# Patient Record
Sex: Female | Born: 2000 | Race: Black or African American | Hispanic: No | Smoking: Current some day smoker
Health system: Southern US, Community
[De-identification: ages and names within clinical notes are randomized; demographics above are authoritative.]

## PROBLEM LIST (undated history)

## (undated) ENCOUNTER — Emergency Department (HOSPITAL_COMMUNITY): Admission: EM | Discharge status: Absent without leave | Payer: Medicaid Other

## (undated) DIAGNOSIS — Z789 Other specified health status: Secondary | ICD-10-CM

## (undated) HISTORY — PX: NO PAST SURGERIES: SHX2092

---
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
## 2018-06-29 ENCOUNTER — Other Ambulatory Visit: Payer: Self-pay

## 2018-06-29 ENCOUNTER — Encounter (HOSPITAL_COMMUNITY): Payer: Self-pay

## 2018-06-29 ENCOUNTER — Emergency Department (HOSPITAL_COMMUNITY): Payer: Self-pay

## 2018-06-29 ENCOUNTER — Emergency Department (HOSPITAL_COMMUNITY)
Admission: EM | Admit: 2018-06-29 | Discharge: 2018-06-30 | Disposition: A | Discharge status: Home / Self Care | Payer: Self-pay | Attending: Emergency Medicine | Admitting: Emergency Medicine

## 2018-06-29 DIAGNOSIS — T148XXA Other injury of unspecified body region, initial encounter: Secondary | ICD-10-CM

## 2018-06-29 DIAGNOSIS — Y929 Unspecified place or not applicable: Secondary | ICD-10-CM | POA: Insufficient documentation

## 2018-06-29 DIAGNOSIS — S81811A Laceration without foreign body, right lower leg, initial encounter: Secondary | ICD-10-CM | POA: Insufficient documentation

## 2018-06-29 DIAGNOSIS — Y939 Activity, unspecified: Secondary | ICD-10-CM | POA: Insufficient documentation

## 2018-06-29 DIAGNOSIS — Y999 Unspecified external cause status: Secondary | ICD-10-CM | POA: Insufficient documentation

## 2018-06-29 HISTORY — DX: Other specified health status: Z78.9

## 2018-06-29 LAB — I-STAT CHEM 8, ED
BUN: 8 mg/dL (ref 6–20)
Calcium, Ion: 1.01 mmol/L — ABNORMAL LOW (ref 1.15–1.40)
Chloride: 106 mmol/L (ref 98–111)
Creatinine, Ser: 1.2 mg/dL — ABNORMAL HIGH (ref 0.44–1.00)
Glucose, Bld: 149 mg/dL — ABNORMAL HIGH (ref 70–99)
HCT: 34 % — ABNORMAL LOW (ref 36.0–46.0)
Hemoglobin: 11.6 g/dL — ABNORMAL LOW (ref 12.0–15.0)
Potassium: 2.9 mmol/L — ABNORMAL LOW (ref 3.5–5.1)
Sodium: 139 mmol/L (ref 135–145)
TCO2: 17 mmol/L — ABNORMAL LOW (ref 22–32)

## 2018-06-29 LAB — BPAM FFP
Blood Product Expiration Date: 202007112359
Blood Product Expiration Date: 202007112359
ISSUE DATE / TIME: 202006232155
ISSUE DATE / TIME: 202006232155
Unit Type and Rh: 600
Unit Type and Rh: 6200

## 2018-06-29 LAB — I-STAT BETA HCG BLOOD, ED (MC, WL, AP ONLY): I-stat hCG, quantitative: <5 m[IU]/mL (ref <5)

## 2018-06-29 LAB — PREPARE FRESH FROZEN PLASMA
Unit division: 0
Unit division: 0

## 2018-06-29 MED ORDER — CEFAZOLIN SODIUM-DEXTROSE 2-4 GM/100ML-% IV SOLN
2.0000 g | Freq: Once | INTRAVENOUS | Status: AC
  Administered 2018-06-29: 2 g via INTRAVENOUS
  Filled 2018-06-29: qty 100

## 2018-06-29 MED ORDER — ACETAMINOPHEN 500 MG PO TABS
1000.0000 mg | ORAL_TABLET | Freq: Once | ORAL | Status: AC
  Administered 2018-06-29: 1000 mg via ORAL
  Filled 2018-06-29: qty 2

## 2018-06-29 MED ORDER — LIDOCAINE-EPINEPHRINE (PF) 2 %-1:200000 IJ SOLN
10.0000 mL | Freq: Once | INTRAMUSCULAR | Status: AC
  Administered 2018-06-29: 10 mL
  Filled 2018-06-29: qty 20

## 2018-06-29 MED ORDER — TETANUS-DIPHTH-ACELL PERTUSSIS 5-2.5-18.5 LF-MCG/0.5 IM SUSP
0.5000 mL | Freq: Once | INTRAMUSCULAR | Status: AC
  Administered 2018-06-29: 0.5 mL via INTRAMUSCULAR
  Filled 2018-06-29: qty 0.5

## 2018-06-29 NOTE — ED Notes (Addendum)
[facility] call for update.
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
## 2018-06-29 NOTE — ED Notes (Signed)
Officer at the bedside speaking to the patient.

## 2018-06-30 LAB — TYPE AND SCREEN
ABO/RH(D): A POS
Antibody Screen: POSITIVE
PT AG Type: NEGATIVE
Unit division: 0
Unit division: 0

## 2018-06-30 LAB — BPAM RBC
Blood Product Expiration Date: 202007092359
Blood Product Expiration Date: 202007092359
ISSUE DATE / TIME: 202006232155
ISSUE DATE / TIME: 202006232155
Unit Type and Rh: 9500
Unit Type and Rh: 9500

## 2018-06-30 MED ORDER — BACITRACIN ZINC 500 UNIT/GM EX OINT: 1.0000 "application " | TOPICAL_OINTMENT | Freq: Two times a day (BID) | CUTANEOUS | 0 refills | Status: DC

## 2018-06-30 MED ORDER — CEPHALEXIN 500 MG PO CAPS: 1000.0000 mg | ORAL_CAPSULE | Freq: Two times a day (BID) | ORAL | 0 refills | Status: DC

## 2018-06-30 NOTE — ED Notes (Signed)
Wound to right leg noted to be bleeding through staples.  Pressure held for several minutes.  No bleeding noted afterwards.  Pressure dressing applied.  Discharged home.  Verbalized understanding of discharge instructions.

## 2018-06-30 NOTE — Discharge Instructions (Addendum)
1.  Keep your leg elevated for the next 48 hours as much as possible.  Continue to elevate and avoid prolonged standing for the next week. 2.  After 24 hours, clean your wounds twice daily with half hydrogen peroxide half water dry well and apply antibiotic ointment. 3.  Follow-up with the trauma clinic or your doctor for removal of your staples in 10 days. 4.  Return to the emergency department if signs of infection such as worsening pain, drainage, fever or other concerning symptoms.

## 2018-07-01 ENCOUNTER — Encounter: Payer: Self-pay | Admitting: Student

## 2018-07-06 ENCOUNTER — Encounter (HOSPITAL_COMMUNITY): Payer: Self-pay | Admitting: Emergency Medicine

## 2018-07-06 ENCOUNTER — Emergency Department (HOSPITAL_COMMUNITY): Payer: Self-pay

## 2018-07-06 ENCOUNTER — Emergency Department (HOSPITAL_COMMUNITY)
Admission: EM | Admit: 2018-07-06 | Discharge: 2018-07-06 | Disposition: A | Discharge status: Home / Self Care | Payer: Self-pay | Attending: Emergency Medicine | Admitting: Emergency Medicine

## 2018-07-06 DIAGNOSIS — R112 Nausea with vomiting, unspecified: Secondary | ICD-10-CM

## 2018-07-06 DIAGNOSIS — R1084 Generalized abdominal pain: Secondary | ICD-10-CM | POA: Insufficient documentation

## 2018-07-06 DIAGNOSIS — F17200 Nicotine dependence, unspecified, uncomplicated: Secondary | ICD-10-CM | POA: Insufficient documentation

## 2018-07-06 LAB — COMPREHENSIVE METABOLIC PANEL
ALT: 15 U/L (ref 0–44)
AST: 20 U/L (ref 15–41)
Albumin: 3.6 g/dL (ref 3.5–5.0)
Alkaline Phosphatase: 39 U/L (ref 38–126)
Anion gap: 8 (ref 5–15)
BUN: 8 mg/dL (ref 6–20)
CO2: 24 mmol/L (ref 22–32)
Calcium: 8.9 mg/dL (ref 8.9–10.3)
Chloride: 108 mmol/L (ref 98–111)
Creatinine, Ser: 0.88 mg/dL (ref 0.44–1.00)
GFR calc Af Amer: >60 mL/min (ref >60)
GFR calc non Af Amer: >60 mL/min (ref >60)
Glucose, Bld: 108 mg/dL — ABNORMAL HIGH (ref 70–99)
Potassium: 4.5 mmol/L (ref 3.5–5.1)
Sodium: 140 mmol/L (ref 135–145)
Total Bilirubin: 0.4 mg/dL (ref 0.3–1.2)
Total Protein: 6.6 g/dL (ref 6.5–8.1)

## 2018-07-06 LAB — CBC WITH DIFFERENTIAL/PLATELET
Abs Immature Granulocytes: 0.02 10*3/uL (ref 0.00–0.07)
Basophils Absolute: 0 10*3/uL (ref 0.0–0.1)
Basophils Relative: 1 %
Eosinophils Absolute: 0 10*3/uL (ref 0.0–0.5)
Eosinophils Relative: 1 %
HCT: 31.7 % — ABNORMAL LOW (ref 36.0–46.0)
Hemoglobin: 10 g/dL — ABNORMAL LOW (ref 12.0–15.0)
Immature Granulocytes: 0 %
Lymphocytes Relative: 21 %
Lymphs Abs: 1.1 10*3/uL (ref 0.7–4.0)
MCH: 29.1 pg (ref 26.0–34.0)
MCHC: 31.5 g/dL (ref 30.0–36.0)
MCV: 92.2 fL (ref 80.0–100.0)
Monocytes Absolute: 0.2 10*3/uL (ref 0.1–1.0)
Monocytes Relative: 5 %
Neutro Abs: 3.6 10*3/uL (ref 1.7–7.7)
Neutrophils Relative %: 72 %
Platelets: 276 10*3/uL (ref 150–400)
RBC: 3.44 MIL/uL — ABNORMAL LOW (ref 3.87–5.11)
RDW: 12.4 % (ref 11.5–15.5)
WBC: 5 10*3/uL (ref 4.0–10.5)
nRBC: 0 % (ref 0.0–0.2)

## 2018-07-06 LAB — URINALYSIS, ROUTINE W REFLEX MICROSCOPIC
Bilirubin Urine: NEGATIVE
Glucose, UA: NEGATIVE mg/dL
Hgb urine dipstick: NEGATIVE
Ketones, ur: NEGATIVE mg/dL
Nitrite: NEGATIVE
Protein, ur: 30 mg/dL — AB
Specific Gravity, Urine: 1.033 — ABNORMAL HIGH (ref 1.005–1.030)
pH: 8 (ref 5.0–8.0)

## 2018-07-06 LAB — I-STAT BETA HCG BLOOD, ED (MC, WL, AP ONLY): I-stat hCG, quantitative: <5 m[IU]/mL (ref <5)

## 2018-07-06 LAB — LIPASE, BLOOD: Lipase: 24 U/L (ref 11–51)

## 2018-07-06 LAB — POC OCCULT BLOOD, ED: Fecal Occult Bld: NEGATIVE

## 2018-07-06 MED ORDER — ONDANSETRON HCL 4 MG PO TABS: 4.0000 mg | ORAL_TABLET | Freq: Three times a day (TID) | ORAL | 0 refills | Status: DC | PRN

## 2018-07-06 MED ORDER — SODIUM CHLORIDE 0.9 % IV BOLUS
1000.0000 mL | Freq: Once | INTRAVENOUS | Status: AC
  Administered 2018-07-06: 11:00:00 1000 mL via INTRAVENOUS

## 2018-07-06 MED ORDER — ONDANSETRON HCL 4 MG/2ML IJ SOLN
4.0000 mg | Freq: Once | INTRAMUSCULAR | Status: AC
  Administered 2018-07-06: 11:00:00 4 mg via INTRAVENOUS

## 2018-07-06 MED ORDER — IOHEXOL 300 MG/ML  SOLN
100.0000 mL | Freq: Once | INTRAMUSCULAR | Status: AC | PRN
  Administered 2018-07-06: 100 mL via INTRAVENOUS

## 2018-07-06 MED ORDER — ONDANSETRON HCL 4 MG/2ML IJ SOLN
INTRAMUSCULAR | Status: AC
Start: 2018-07-06 — End: 2018-07-06
  Administered 2018-07-06: 11:00:00 4 mg
  Filled 2018-07-06: qty 2
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
## 2018-07-06 NOTE — Discharge Instructions (Signed)
You were seen in the ED today for abdominal pain, nausea, and vomiting. Your labwork was reassuring today and your CT scan was negative. Your urine was sent out for culture; you will get a call if it grows anything that requires antibiotics. Please take Zofran as needed for nausea. Please increase fluid intake. Please follow up with your PCP; if you do not have one you may follow up with [facility] for your primary care needs.

## 2018-07-06 NOTE — ED Triage Notes (Signed)
Vomiting starting this morning about 0700- denies pregnancy. Diarr also reported. And Denies drinking alcohol. No one in house sick.

## 2018-07-07 LAB — URINE CULTURE: Culture: <10000 — AB

## 2018-07-09 ENCOUNTER — Other Ambulatory Visit: Payer: Self-pay

## 2018-07-09 ENCOUNTER — Emergency Department (HOSPITAL_COMMUNITY)
Admission: EM | Admit: 2018-07-09 | Discharge: 2018-07-09 | Disposition: A | Discharge status: Home / Self Care | Payer: Medicaid Other | Attending: Pediatric Emergency Medicine | Admitting: Pediatric Emergency Medicine

## 2018-07-09 ENCOUNTER — Encounter (HOSPITAL_COMMUNITY): Payer: Self-pay | Admitting: Emergency Medicine

## 2018-07-09 DIAGNOSIS — Z79899 Other long term (current) drug therapy: Secondary | ICD-10-CM | POA: Insufficient documentation

## 2018-07-09 DIAGNOSIS — Z4802 Encounter for removal of sutures: Secondary | ICD-10-CM

## 2018-07-09 DIAGNOSIS — B373 Candidiasis of vulva and vagina: Secondary | ICD-10-CM | POA: Insufficient documentation

## 2018-07-09 DIAGNOSIS — B379 Candidiasis, unspecified: Secondary | ICD-10-CM

## 2018-07-09 DIAGNOSIS — A599 Trichomoniasis, unspecified: Secondary | ICD-10-CM | POA: Insufficient documentation

## 2018-07-09 DIAGNOSIS — F17200 Nicotine dependence, unspecified, uncomplicated: Secondary | ICD-10-CM | POA: Insufficient documentation

## 2018-07-09 DIAGNOSIS — R300 Dysuria: Secondary | ICD-10-CM

## 2018-07-09 DIAGNOSIS — S81811D Laceration without foreign body, right lower leg, subsequent encounter: Secondary | ICD-10-CM | POA: Insufficient documentation

## 2018-07-09 DIAGNOSIS — X58XXXA Exposure to other specified factors, initial encounter: Secondary | ICD-10-CM | POA: Insufficient documentation

## 2018-07-09 LAB — WET PREP, GENITAL
Clue Cells Wet Prep HPF POC: NONE SEEN
Sperm: NONE SEEN

## 2018-07-09 LAB — RAPID HIV SCREEN (HIV 1/2 AB+AG)
HIV 1/2 Antibodies: NONREACTIVE
HIV-1 P24 Antigen - HIV24: NONREACTIVE

## 2018-07-09 LAB — URINALYSIS, ROUTINE W REFLEX MICROSCOPIC
Bacteria, UA: NONE SEEN
Bilirubin Urine: NEGATIVE
Glucose, UA: NEGATIVE mg/dL
Hgb urine dipstick: NEGATIVE
Ketones, ur: 5 mg/dL — AB
Nitrite: NEGATIVE
Protein, ur: 30 mg/dL — AB
Specific Gravity, Urine: 1.032 — ABNORMAL HIGH (ref 1.005–1.030)
WBC, UA: >50 WBC/hpf — ABNORMAL HIGH (ref 0–5)
pH: 5 (ref 5.0–8.0)

## 2018-07-09 LAB — PREGNANCY, URINE: Preg Test, Ur: NEGATIVE

## 2018-07-09 MED ORDER — AZITHROMYCIN 1 G PO PACK
1.0000 g | PACK | Freq: Once | ORAL | Status: AC
  Administered 2018-07-09: 1 g via ORAL
  Filled 2018-07-09: qty 1

## 2018-07-09 MED ORDER — METRONIDAZOLE 500 MG PO TABS: 500.0000 mg | ORAL_TABLET | Freq: Two times a day (BID) | ORAL | 0 refills | Status: DC

## 2018-07-09 MED ORDER — CEFTRIAXONE SODIUM 1 G IJ SOLR
1.0000 g | Freq: Once | INTRAMUSCULAR | Status: AC
  Administered 2018-07-09: 1 g via INTRAMUSCULAR
  Filled 2018-07-09: qty 10

## 2018-07-09 MED ORDER — FLUCONAZOLE 150 MG PO TABS: 150.0000 mg | ORAL_TABLET | ORAL | 0 refills | Status: AC

## 2018-07-09 MED ORDER — LIDOCAINE HCL (PF) 1 % IJ SOLN
INTRAMUSCULAR | Status: AC
Start: 2018-07-09 — End: 2018-07-09
  Administered 2018-07-09: 2.1 mL
  Filled 2018-07-09: qty 5

## 2018-07-09 NOTE — ED Triage Notes (Signed)
Patient requesting staples removal at right lower leg , pt. added UTI symptoms - dysuria /urinary frequency , denies fever or chills .
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
## 2018-07-09 NOTE — Discharge Instructions (Addendum)
Continue to apply the bacitracin to your wound. It is healing well.   Your urinalysis suggests mild UTI. Several of your tests are pending as well.   You also have a yeast infection, as well as trichomoniasis.   Please establish primary care, resources were given.   You may return to the ED for new/worsening concerns as discussed.   Your HIV and Syphilis tests are pending. The gonorrhea and chlamydia testing is also pending. You were giving antibiotics to treat Gonorrhea and Chlamydia. You should use condoms and have one partner. You are increasing your risk of long-term effects from having unprotected sex. You must notify your sexual partners that you were treated, so that they can be treated. You should not have any sex for 2 weeks. You could re-infect yourself. Follow up with the Health Department.

## 2018-07-10 LAB — URINE CULTURE: Culture: NO GROWTH

## 2018-07-11 LAB — RPR: RPR Ser Ql: NONREACTIVE

## 2018-07-13 LAB — GC/CHLAMYDIA PROBE AMP (~~LOC~~) NOT AT ARMC
Chlamydia: NEGATIVE
Neisseria Gonorrhea: NEGATIVE

## 2018-07-30 ENCOUNTER — Other Ambulatory Visit: Payer: Self-pay

## 2018-07-30 ENCOUNTER — Emergency Department (HOSPITAL_BASED_OUTPATIENT_CLINIC_OR_DEPARTMENT_OTHER): Payer: Self-pay

## 2018-07-30 ENCOUNTER — Emergency Department (HOSPITAL_COMMUNITY)
Admission: EM | Admit: 2018-07-30 | Discharge: 2018-07-30 | Disposition: A | Discharge status: Home / Self Care | Payer: Self-pay | Attending: Emergency Medicine | Admitting: Emergency Medicine

## 2018-07-30 DIAGNOSIS — R52 Pain, unspecified: Secondary | ICD-10-CM

## 2018-07-30 DIAGNOSIS — F17210 Nicotine dependence, cigarettes, uncomplicated: Secondary | ICD-10-CM | POA: Insufficient documentation

## 2018-07-30 DIAGNOSIS — M7989 Other specified soft tissue disorders: Secondary | ICD-10-CM

## 2018-07-30 DIAGNOSIS — R2241 Localized swelling, mass and lump, right lower limb: Secondary | ICD-10-CM | POA: Insufficient documentation

## 2018-07-30 DIAGNOSIS — S81811D Laceration without foreign body, right lower leg, subsequent encounter: Secondary | ICD-10-CM | POA: Insufficient documentation

## 2018-07-30 DIAGNOSIS — W260XXD Contact with knife, subsequent encounter: Secondary | ICD-10-CM | POA: Insufficient documentation

## 2018-07-30 NOTE — Discharge Instructions (Addendum)
Elevate your right leg as much as possible when you are resting.  It is good to stay active and keep moving.  This should help with the swelling as well. Follow-up with your primary care provider if symptoms persist. If you develop redness, pain, or significantly worsening swelling to the leg, you may need reevaluation in the ER.
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
## 2018-07-30 NOTE — Progress Notes (Signed)
Right lower extremity venous duplex completed. Preliminary results in Chart review CV Proc. [place] Tracee Mccreery,RVS 07/30/2018, 6:32 PM
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
## 2019-02-05 ENCOUNTER — Other Ambulatory Visit: Payer: Self-pay

## 2019-02-05 ENCOUNTER — Encounter (HOSPITAL_COMMUNITY): Payer: Self-pay | Admitting: Emergency Medicine

## 2019-02-05 ENCOUNTER — Emergency Department (HOSPITAL_COMMUNITY)
Admission: EM | Admit: 2019-02-05 | Discharge: 2019-02-05 | Disposition: A | Discharge status: Home / Self Care | Payer: BLUE CROSS/BLUE SHIELD | Attending: Emergency Medicine | Admitting: Emergency Medicine

## 2019-02-05 DIAGNOSIS — Z20822 Contact with and (suspected) exposure to covid-19: Secondary | ICD-10-CM | POA: Diagnosis not present

## 2019-02-05 DIAGNOSIS — R0602 Shortness of breath: Secondary | ICD-10-CM | POA: Insufficient documentation

## 2019-02-05 DIAGNOSIS — R5383 Other fatigue: Secondary | ICD-10-CM | POA: Diagnosis not present

## 2019-02-05 DIAGNOSIS — U071 COVID-19: Secondary | ICD-10-CM

## 2019-02-05 DIAGNOSIS — R438 Other disturbances of smell and taste: Secondary | ICD-10-CM | POA: Diagnosis present

## 2019-02-05 DIAGNOSIS — F17290 Nicotine dependence, other tobacco product, uncomplicated: Secondary | ICD-10-CM | POA: Diagnosis not present

## 2019-02-05 NOTE — ED Notes (Signed)
Patient given discharge instructions patient verbalizes understanding. 

## 2019-02-05 NOTE — ED Notes (Signed)
Pt waiting for provider.

## 2019-02-05 NOTE — Discharge Instructions (Signed)
Today you were tested for COVID-19, these results should be available to you within the next 24 hours.  Please quarantine at home for the next 14 days, will need to limit encounters with other people to avoid spreading the virus.

## 2019-02-05 NOTE — ED Triage Notes (Signed)
Pt requesting COVID testing.  C/o sore throat, loss taste, and loss smell since yesterday.

## 2019-02-05 NOTE — ED Provider Notes (Signed)
[facility] EMERGENCY DEPARTMENT Provider Note   CSN: [id number] Arrival date & time: 02/05/19  1007     History Chief Complaint  Patient presents with  . COVID testing    Brittany Hoffman is a 19 y.o. female.  19 y.o female with no PMH presents to the ED with a chief complaint of loss of taste and smell x yesterday. Patient reports she has felt more fatigued than usual since the past week. She reports she has been increasing her liquids but has not taking any medication.  Patient reports she is also felt more short of breath, reports discussed for her to take a deep breath in the past week.  She is unsure if she is had any exposures to anyone with COVID-19.  She reports she is not having any fevers, does have some abdominal discomfort but no nausea, vomiting, diarrhea.  No chest pain, prior history of CAD or blood clots.  The history is provided by the patient.       Past Medical History:  Diagnosis Date  . Known health problems: none     Patient Active Problem List   Diagnosis Date Noted  . Chronic pain syndrome 08/13/2015  . Menorrhagia with regular cycle 11/20/2014  . Right knee pain 12/01/2012  . Contraception 10/29/2012  . Chronic headaches 10/29/2012  . Tinea versicolor 05/23/2010  . UNSPECIFIED ACQUIRED DEFORMITY OF TOE 08/21/2009  . OVERWEIGHT 03/28/2009  . VISUAL ACUITY, DECREASED 03/28/2009  . 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 BACK PAIN 03/28/2009    Past Surgical History:  Procedure Laterality Date  . NO PAST SURGERIES       OB History   No obstetric history on file.     Family History  Problem Relation Age of Onset  . Hypertension Mother     Social History   Tobacco Use  . Smoking status: Current Some Day Smoker    Types: Cigars  . Smokeless tobacco: Never Used  Substance Use Topics  . Alcohol use: Yes  . Drug use: Yes    Home Medications Prior to Admission medications   Medication Sig Start Date End Date Taking?  Authorizing Provider  adapalene (DIFFERIN) 0.1 % cream Apply topically at bedtime. to face (apply small amount)    [provider]  bacitracin ointment Apply 1 application topically 2 (two) times daily. 06/30/18   Charlesetta Shanks, MD  cephALEXin (KEFLEX) 500 MG capsule Take 2 capsules (1,000 mg total) by mouth 2 (two) times daily. 06/30/18   Charlesetta Shanks, MD  ciprofloxacin (CIPRO) 250 MG tablet Take 1 tablet (250 mg total) by mouth every 12 (twelve) hours. 12/24/16   Luvenia Redden, PA-C  ibuprofen (ADVIL,MOTRIN) 600 MG tablet Take 1 tablet (600 mg total) by mouth every 8 (eight) hours as needed. 12/01/12   Hairford, Tyler Pita, MD  metroNIDAZOLE (FLAGYL) 500 MG tablet Take 1 tablet (500 mg total) by mouth 2 (two) times daily. 07/09/18   Griffin Basil, NP  norgestimate-ethinyl estradiol (ORTHO-CYCLEN,SPRINTEC,PREVIFEM) 0.25-35 MG-MCG tablet Take 1 tablet by mouth daily. 11/20/14   Veatrice Bourbon, MD  ondansetron (ZOFRAN) 4 MG tablet Take 1 tablet (4 mg total) by mouth every 8 (eight) hours as needed for nausea or vomiting. 07/06/18   Eustaquio Maize, PA-C    Allergies    Patient has no known allergies.  Review of Systems   Review of Systems  Constitutional: Negative for fever.  Respiratory: Negative for shortness of breath.   Cardiovascular: Negative for  chest pain.  Gastrointestinal: Negative for abdominal pain.    Physical Exam Updated Vital Signs BP 114/85 (BP Location: Left Arm)   Pulse (!) 59   Temp 98.2 F (36.8 C) (Oral)   Resp 17   LMP 01/22/2019   SpO2 100%   Physical Exam Vitals and nursing note reviewed.  Constitutional:      General: She is not in acute distress.    Appearance: She is well-developed.  HENT:     Head: Normocephalic and atraumatic.     Mouth/Throat:     Pharynx: No oropharyngeal exudate.  Eyes:     Pupils: Pupils are equal, round, and reactive to light.  Cardiovascular:     Rate and Rhythm: Regular rhythm.     Heart sounds: Normal heart  sounds.  Pulmonary:     Effort: Pulmonary effort is normal. No respiratory distress.     Breath sounds: Normal breath sounds.  Abdominal:     General: Bowel sounds are normal. There is no distension.     Palpations: Abdomen is soft.     Tenderness: There is no abdominal tenderness.  Musculoskeletal:        General: No tenderness or deformity.     Cervical back: Normal range of motion.     Right lower leg: No edema.     Left lower leg: No edema.  Skin:    General: Skin is warm and dry.  Neurological:     Mental Status: She is alert and oriented to person, place, and time.     ED Results / Procedures / Treatments   Labs (all labs ordered are listed, but only abnormal results are displayed) Labs Reviewed  NOVEL CORONAVIRUS, NAA (HOSP ORDER, SEND-OUT TO REF LAB; TAT 18-24 HRS)    EKG None  Radiology No results found.  Procedures Procedures (including critical care time)  Medications Ordered in ED Medications - No data to display  ED Course  I have reviewed the triage vital signs and the nursing notes.  Pertinent labs & imaging results that were available during my care of the patient were reviewed by me and considered in my medical decision making (see chart for details).    MDM Rules/Calculators/A&P   Patient with different past medical history presents to the ED with complaints of shortness of breath, loss of taste and smell, fatigue.  Patient is unsure where she had exposure to anybody with COVID-19.  Has been decreasing her liquid intake, but has not seen improvement in her symptoms.  Patient arrived in the ED with stable vital signs, satting at 100% on room air, afebrile, no tachycardia or hypoxia lower suspicion for pulmonary embolism.  Denies any chest pain, prior cardiac history lower solution for ACS.  Patient will have Covid testing done and sent out.  She understands and agrees with management along with further testing if needed on an outpatient basis.   Brittany Hoffman was evaluated in Emergency Department on 02/05/2019 for the symptoms described in the history of present illness. She was evaluated in the context of the global COVID-19 pandemic, which necessitated consideration that the patient might be at risk for infection with the SARS-CoV-2 virus that causes COVID-19. Institutional protocols and algorithms that pertain to the evaluation of patients at risk for COVID-19 are in a state of rapid change based on information released by regulatory bodies including the CDC and federal and state organizations. These policies and algorithms were followed during the patient's care in the ED.   Portions  of this note were generated with Scientist, clinical (histocompatibility and immunogenetics). Dictation errors may occur despite best attempts at proofreading.  Final Clinical Impression(s) / ED Diagnoses Final diagnoses:  COVID-19    Rx / DC Orders ED Discharge Orders    None       Claude Manges, PA-C 02/05/19 1138    Margarita Grizzle, MD 02/06/19 [phone]

## 2019-02-06 LAB — NOVEL CORONAVIRUS, NAA (HOSP ORDER, SEND-OUT TO REF LAB; TAT 18-24 HRS): SARS-CoV-2, NAA: NOT DETECTED

## 2019-04-07 ENCOUNTER — Other Ambulatory Visit: Payer: Self-pay

## 2019-04-07 ENCOUNTER — Emergency Department (HOSPITAL_COMMUNITY)
Admission: EM | Admit: 2019-04-07 | Discharge: 2019-04-07 | Disposition: A | Discharge status: Home / Self Care | Payer: BLUE CROSS/BLUE SHIELD | Attending: Emergency Medicine | Admitting: Emergency Medicine

## 2019-04-07 ENCOUNTER — Encounter (HOSPITAL_COMMUNITY): Payer: Self-pay | Admitting: Emergency Medicine

## 2019-04-07 DIAGNOSIS — Z202 Contact with and (suspected) exposure to infections with a predominantly sexual mode of transmission: Secondary | ICD-10-CM | POA: Diagnosis present

## 2019-04-07 DIAGNOSIS — Z711 Person with feared health complaint in whom no diagnosis is made: Secondary | ICD-10-CM

## 2019-04-07 DIAGNOSIS — Z720 Tobacco use: Secondary | ICD-10-CM | POA: Diagnosis not present

## 2019-04-07 DIAGNOSIS — B373 Candidiasis of vulva and vagina: Secondary | ICD-10-CM | POA: Insufficient documentation

## 2019-04-07 DIAGNOSIS — B3731 Acute candidiasis of vulva and vagina: Secondary | ICD-10-CM

## 2019-04-07 LAB — WET PREP, GENITAL
Sperm: NONE SEEN
Trich, Wet Prep: NONE SEEN

## 2019-04-07 LAB — POC URINE PREG, ED: Preg Test, Ur: NEGATIVE

## 2019-04-07 MED ORDER — CEFTRIAXONE SODIUM 500 MG IJ SOLR
500.0000 mg | Freq: Once | INTRAMUSCULAR | Status: AC
  Administered 2019-04-07: 11:00:00 500 mg via INTRAMUSCULAR
  Filled 2019-04-07: qty 500

## 2019-04-07 MED ORDER — LIDOCAINE HCL (PF) 1 % IJ SOLN
INTRAMUSCULAR | Status: AC
  Administered 2019-04-07: 11:00:00 1 mL
  Filled 2019-04-07: qty 5

## 2019-04-07 MED ORDER — FLUCONAZOLE 200 MG PO TABS
200.0000 mg | ORAL_TABLET | Freq: Once | ORAL | Status: AC
  Administered 2019-04-07: 11:00:00 200 mg via ORAL
  Filled 2019-04-07: qty 1

## 2019-04-07 MED ORDER — DOXYCYCLINE HYCLATE 100 MG PO CAPS: 100.0000 mg | ORAL_CAPSULE | Freq: Two times a day (BID) | ORAL | 0 refills | Status: AC

## 2019-04-07 NOTE — ED Triage Notes (Signed)
Pt. Presents from home c/o vaginal discharge, irritation and swelling for the past two days. Patient denies fever or chills.

## 2019-04-07 NOTE — Discharge Instructions (Signed)
In the emergency department today you were diagnosed with a yeast infection.  You were given a single dose of fluconazole which should alleviate the symptoms.  We do not yet have the results back for your gonorrhea and Chlamydia test.  Please check your MyChart account for the next few days for the results of this test.  In the meantime, per our discussion, you have elected to go ahead and treat the symptoms.  You are given an injection of Rocephin in the emergency department and I am also discharging you on 7 days of an antibiotic called doxycycline.  Please be sure to take this antibiotic twice a day.  Please be sure to complete the full course of this antibiotic.  Please do not hesitate to return to the emergency department with any new or worsening symptoms.

## 2019-04-07 NOTE — ED Provider Notes (Signed)
[facility] EMERGENCY DEPARTMENT Provider Note   CSN: [id number] Arrival date & time: 04/07/19  0900     History Chief Complaint  Patient presents with  . Vaginal Discharge    Brittany Hoffman is a 19 y.o. female.  HPI 19 y.o. female with complaints/comments per nursing/medical assistant note, with all such history reviewed for accuracy and confirmed by myself.  The patient's relevant past medical, surgical and social history was reviewed in Epic.  HPI Comments: Brittany Hoffman is a 19 y.o. female with a history of trichomoniasis last year who presents to the Emergency Department complaining of vaginal discharge for the past 3 days.  Patient states that she began noticing white/clumpy nonodorous vaginal discharge which has been progressively worsening.  She reports associated vaginal irritation.  She is not on any regular medications.  She states she is sexually active with 1 female partner and denies that they use protection.  Her last menstrual period was on March 4 of last month and lasted 7 days, which she states is normal for her.  She denies dysuria, hematuria, any urinary changes, fevers, chills, chest pain, shortness of breath, vaginal bleeding.    Past Medical History:  Diagnosis Date  . Known health problems: none     Patient Active Problem List   Diagnosis Date Noted  . Chronic pain syndrome 08/13/2015  . Menorrhagia with regular cycle 11/20/2014  . Right knee pain 12/01/2012  . Contraception 10/29/2012  . Chronic headaches 10/29/2012  . Tinea versicolor 05/23/2010  . UNSPECIFIED ACQUIRED DEFORMITY OF TOE 08/21/2009  . OVERWEIGHT 03/28/2009  . VISUAL ACUITY, DECREASED 03/28/2009  . 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 BACK PAIN 03/28/2009    Past Surgical History:  Procedure Laterality Date  . NO PAST SURGERIES       OB History   No obstetric history on file.     Family History  Problem Relation Age of Onset  . Hypertension Mother     Social  History   Tobacco Use  . Smoking status: Current Some Day Smoker    Types: Cigars  . Smokeless tobacco: Never Used  Substance Use Topics  . Alcohol use: Yes  . Drug use: Yes    Home Medications Prior to Admission medications   Medication Sig Start Date End Date Taking? Authorizing Provider  adapalene (DIFFERIN) 0.1 % cream Apply topically at bedtime. to face (apply small amount)    [provider]  bacitracin ointment Apply 1 application topically 2 (two) times daily. 06/30/18   Arby Barrette, MD  cephALEXin (KEFLEX) 500 MG capsule Take 2 capsules (1,000 mg total) by mouth 2 (two) times daily. 06/30/18   Arby Barrette, MD  ciprofloxacin (CIPRO) 250 MG tablet Take 1 tablet (250 mg total) by mouth every 12 (twelve) hours. 12/24/16   Marny Lowenstein, PA-C  ibuprofen (ADVIL,MOTRIN) 600 MG tablet Take 1 tablet (600 mg total) by mouth every 8 (eight) hours as needed. 12/01/12   Hairford, Ricki Miller, MD  metroNIDAZOLE (FLAGYL) 500 MG tablet Take 1 tablet (500 mg total) by mouth 2 (two) times daily. 07/09/18   Lorin Picket, NP  norgestimate-ethinyl estradiol (ORTHO-CYCLEN,SPRINTEC,PREVIFEM) 0.25-35 MG-MCG tablet Take 1 tablet by mouth daily. 11/20/14   Bonney Aid, MD  ondansetron (ZOFRAN) 4 MG tablet Take 1 tablet (4 mg total) by mouth every 8 (eight) hours as needed for nausea or vomiting. 07/06/18   Tanda Rockers, PA-C    Allergies    Patient has no known allergies.  Review of Systems   Review of Systems  Constitutional: Negative for chills and fever.  HENT: Negative for congestion and rhinorrhea.   Respiratory: Negative for shortness of breath.   Cardiovascular: Negative for chest pain and leg swelling.  Gastrointestinal: Negative for abdominal pain, diarrhea, nausea and vomiting.  Genitourinary: Positive for vaginal discharge and vaginal pain. Negative for decreased urine volume, difficulty urinating, dysuria, frequency and vaginal bleeding.  All other systems reviewed  and are negative.  Physical Exam Updated Vital Signs BP 119/73 (BP Location: Left Arm)   Pulse 64   Temp 98.4 F (36.9 C) (Oral)   Resp 16   Ht 6' (1.829 m)   Wt 83.9 kg   LMP 03/08/2019 (Exact Date)   SpO2 100%   BMI 25.09 kg/m   Physical Exam Vitals and nursing note reviewed.  Constitutional:      Appearance: Normal appearance. She is normal weight.  HENT:     Head: Normocephalic and atraumatic.     Right Ear: External ear normal.     Left Ear: External ear normal.     Nose: Nose normal.     Mouth/Throat:     Mouth: Mucous membranes are moist.     Pharynx: Oropharynx is clear. No oropharyngeal exudate or posterior oropharyngeal erythema.  Eyes:     Extraocular Movements: Extraocular movements intact.  Cardiovascular:     Rate and Rhythm: Normal rate.     Pulses: Normal pulses.  Pulmonary:     Effort: Pulmonary effort is normal.  Abdominal:     General: Abdomen is flat.     Tenderness: There is no abdominal tenderness.  Genitourinary:    Comments: Pelvic exam performed with a female nursing chaperone.  Normal vulvar anatomy visualized.  No overlying erythema or edema appreciated.  No palpable vulvar tenderness. Normal-appearing vaginal mucosa.  Cervix is diffusely erythematous.  Moderate amount of yellow/white vaginal discharge appreciated in the vaginal vault.  Negative chandelier sign.  Cervical os is closed. Musculoskeletal:        General: Normal range of motion.     Cervical back: Normal range of motion.  Skin:    General: Skin is warm and dry.     Capillary Refill: Capillary refill takes less than 2 seconds.  Neurological:     General: No focal deficit present.     Mental Status: She is alert and oriented to person, place, and time.  Psychiatric:        Mood and Affect: Mood normal.        Behavior: Behavior normal.    ED Results / Procedures / Treatments   Labs (all labs ordered are listed, but only abnormal results are displayed) Labs Reviewed  WET  PREP, GENITAL - Abnormal; Notable for the following components:      Result Value   Yeast Wet Prep HPF POC PRESENT (*)    Clue Cells Wet Prep HPF POC PRESENT (*)    WBC, Wet Prep HPF POC MANY (*)    All other components within normal limits  POC URINE PREG, ED  GC/CHLAMYDIA PROBE AMP ([facility]) NOT AT [facility]   EKG None  Radiology No results found.  Procedures Procedures   Medications Ordered in ED Medications  cefTRIAXone (ROCEPHIN) injection 500 mg (has no administration in time range)  fluconazole (DIFLUCAN) tablet 200 mg (has no administration in time range)    ED Course  I have reviewed the triage vital signs and the nursing notes.  Pertinent labs &  imaging results that were available during my care of the patient were reviewed by me and considered in my medical decision making (see chart for details).    MDM Rules/Calculators/A&P                       Patient is a 19 year old African-American female that presents to the emergency department with 2 days of vaginal discharge and irritation.  Wet prep is significant for yeast as well as white blood cells.  Urine pregnancy test is negative.  Gonorrhea/chlamydia is pending.  Patient elected to treat gonorrhea/chlamydia empirically.  Patient given a single dose of oral fluconazole in the emergency department for yeast.  Single Rocephin injection given IM for empiric treatment of gonorrhea.  Will discharge patient on 7 days twice daily of doxycycline for empiric coverage of chlamydia.  Discussed this with patient and she understands to continue to check her results in MyChart for the next few days.  She also understands that she needs to discuss her results if positive with all of her sexual partners so they can additionally be treated.  Strict return precautions given and she understands to return to the emergency department with any new or worsening symptoms.  Her questions were answered.  She was amicable at the time of discharge.   Vital signs stable.  Final Clinical Impression(s) / ED Diagnoses Final diagnoses:  Vaginal yeast infection  Concern about STD in female without diagnosis    Rx / DC Orders ED Discharge Orders         Ordered    doxycycline (VIBRAMYCIN) 100 MG capsule  2 times daily     04/07/19 1055           Rayna Sexton, PA-C 04/07/19 1107    Sherwood Gambler, MD 04/07/19 1611

## 2019-04-08 LAB — GC/CHLAMYDIA PROBE AMP (~~LOC~~) NOT AT ARMC
Chlamydia: NEGATIVE
Comment: NEGATIVE
Comment: NORMAL
Neisseria Gonorrhea: NEGATIVE

## 2019-11-12 ENCOUNTER — Emergency Department (HOSPITAL_COMMUNITY)
Admission: EM | Admit: 2019-11-12 | Discharge: 2019-11-12 | Disposition: A | Discharge status: Home / Self Care | Payer: Medicaid Other | Attending: Emergency Medicine | Admitting: Emergency Medicine

## 2019-11-12 ENCOUNTER — Encounter (HOSPITAL_COMMUNITY): Payer: Self-pay | Admitting: Emergency Medicine

## 2019-11-12 ENCOUNTER — Encounter (HOSPITAL_COMMUNITY): Payer: Self-pay | Admitting: *Deleted

## 2019-11-12 ENCOUNTER — Other Ambulatory Visit: Payer: Self-pay

## 2019-11-12 DIAGNOSIS — Z5321 Procedure and treatment not carried out due to patient leaving prior to being seen by health care provider: Secondary | ICD-10-CM | POA: Insufficient documentation

## 2019-11-12 DIAGNOSIS — H5789 Other specified disorders of eye and adnexa: Secondary | ICD-10-CM

## 2019-11-12 DIAGNOSIS — H02843 Edema of right eye, unspecified eyelid: Secondary | ICD-10-CM | POA: Insufficient documentation

## 2019-11-12 DIAGNOSIS — H5713 Ocular pain, bilateral: Secondary | ICD-10-CM | POA: Insufficient documentation

## 2019-11-12 DIAGNOSIS — F17290 Nicotine dependence, other tobacco product, uncomplicated: Secondary | ICD-10-CM | POA: Insufficient documentation

## 2019-11-12 MED ORDER — FLUORESCEIN SODIUM 1 MG OP STRP
1.0000 | ORAL_STRIP | Freq: Once | OPHTHALMIC | Status: AC
  Administered 2019-11-12: 1 via OPHTHALMIC
  Filled 2019-11-12: qty 1

## 2019-11-12 MED ORDER — TETRACAINE HCL 0.5 % OP SOLN
1.0000 [drp] | Freq: Once | OPHTHALMIC | Status: AC
  Administered 2019-11-12: 1 [drp] via OPHTHALMIC
  Filled 2019-11-12: qty 4

## 2019-11-12 MED ORDER — AMOXICILLIN-POT CLAVULANATE 875-125 MG PO TABS: 1.0000 | ORAL_TABLET | Freq: Two times a day (BID) | ORAL | 0 refills | Status: DC

## 2019-11-12 NOTE — ED Triage Notes (Signed)
Patient states that she woke up with her eye swollen and painful. Patient states eye is draining, sclera noted to be red. Patient unsure of injury.

## 2019-11-12 NOTE — Discharge Instructions (Signed)
Take antibiotics as directed.  Complete the entire course of antibiotics regardless of symptom improvement prevent worsening or recurrence of your infection. If your symptoms do not improve in 24 to 48 hours you will need to return to the ER.  For worsening swelling, trouble moving your eye, pain with moving your eye, fevers, neck stiffness. Follow-up with the eye doctor listed below. Avoid putting anything in your eye such as contact lenses or make-up.

## 2019-11-12 NOTE — ED Triage Notes (Signed)
States she woke up yest am with swelling to right eye, c/o pain and right eye draining states its causing her left eye to hurt as well.

## 2019-11-12 NOTE — ED Provider Notes (Signed)
[facility]-EMERGENCY DEPT Provider Note   CSN: [id number] Arrival date & time: 11/12/19  9379     History Chief Complaint  Patient presents with  . Eye Pain    Brittany Hoffman is a 19 y.o. female who presents to ED with a chief complaint of right eyelid swelling, redness and drainage.  Symptoms began about 2 days ago.  She is unable to recall any incident that may have triggered the symptoms.  Reports intermittent clear drainage.  She has not tried any medications to help with her symptoms, has not tried any over-the-counter eyedrops.  Does not wear contact lenses.  Unsure of any trauma.  Denies any pain with EOMs, photophobia, fever, neck stiffness, sick contacts with similar symptoms, vision changes.  HPI     Past Medical History:  Diagnosis Date  . Known health problems: none     Patient Active Problem List   Diagnosis Date Noted  . Chronic pain syndrome 08/13/2015  . Menorrhagia with regular cycle 11/20/2014  . Right knee pain 12/01/2012  . Contraception 10/29/2012  . Chronic headaches 10/29/2012  . Tinea versicolor 05/23/2010  . UNSPECIFIED ACQUIRED DEFORMITY OF TOE 08/21/2009  . OVERWEIGHT 03/28/2009  . VISUAL ACUITY, DECREASED 03/28/2009  . 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 BACK PAIN 03/28/2009    Past Surgical History:  Procedure Laterality Date  . NO PAST SURGERIES       OB History   No obstetric history on file.     Family History  Problem Relation Age of Onset  . Hypertension Mother     Social History   Tobacco Use  . Smoking status: Current Some Day Smoker    Types: Cigars  . Smokeless tobacco: Never Used  Vaping Use  . Vaping Use: Never used  Substance Use Topics  . Alcohol use: Yes  . Drug use: Yes    Home Medications Prior to Admission medications   Medication Sig Start Date End Date Taking? Authorizing Provider  adapalene (DIFFERIN) 0.1 % cream Apply topically at bedtime. to face (apply small amount)    [provider]  amoxicillin-clavulanate (AUGMENTIN) 875-125 MG tablet Take 1 tablet by mouth every 12 (twelve) hours. 11/12/19   Zamere Pasternak, PA-C  bacitracin ointment Apply 1 application topically 2 (two) times daily. 06/30/18   Arby Barrette, MD  cephALEXin (KEFLEX) 500 MG capsule Take 2 capsules (1,000 mg total) by mouth 2 (two) times daily. 06/30/18   Arby Barrette, MD  ciprofloxacin (CIPRO) 250 MG tablet Take 1 tablet (250 mg total) by mouth every 12 (twelve) hours. 12/24/16   Marny Lowenstein, PA-C  ibuprofen (ADVIL,MOTRIN) 600 MG tablet Take 1 tablet (600 mg total) by mouth every 8 (eight) hours as needed. 12/01/12   Hairford, Ricki Miller, MD  metroNIDAZOLE (FLAGYL) 500 MG tablet Take 1 tablet (500 mg total) by mouth 2 (two) times daily. 07/09/18   Lorin Picket, NP  norgestimate-ethinyl estradiol (ORTHO-CYCLEN,SPRINTEC,PREVIFEM) 0.25-35 MG-MCG tablet Take 1 tablet by mouth daily. 11/20/14   Bonney Aid, MD  ondansetron (ZOFRAN) 4 MG tablet Take 1 tablet (4 mg total) by mouth every 8 (eight) hours as needed for nausea or vomiting. 07/06/18   Tanda Rockers, PA-C    Allergies    Patient has no known allergies.  Review of Systems   Review of Systems  Constitutional: Negative for chills and fever.  HENT: Negative for facial swelling.   Eyes: Positive for discharge and redness. Negative for photophobia, pain, itching and  visual disturbance.    Physical Exam Updated Vital Signs BP 128/77   Pulse 78   Temp 98.2 F (36.8 C) (Oral)   Resp 18   SpO2 99%   Physical Exam Vitals and nursing note reviewed.  Constitutional:      General: She is not in acute distress.    Appearance: She is well-developed. She is not diaphoretic.  HENT:     Head: Normocephalic and atraumatic.  Eyes:     General: Vision grossly intact. No scleral icterus.       Right eye: Discharge present.     Extraocular Movements: Extraocular movements intact.     Conjunctiva/sclera:     Right eye: Right  conjunctiva is injected.     Comments: Right eye with injected conjunctiva, mild eyelid swelling noted. No erythema or tenderness to palpation.  Mild clear tearful drainage noted.  No foreign bodies noted.  No pain with EOMs.  No chemosis, proptosis, or consensual photophobia. Fluorescein stain with no corneal abrasions, foreign bodies, dendritic lesions, ulcerations, negative Sidel sign.  Pulmonary:     Effort: Pulmonary effort is normal. No respiratory distress.  Musculoskeletal:     Cervical back: Normal range of motion.  Skin:    Findings: No rash.  Neurological:     Mental Status: She is alert.     ED Results / Procedures / Treatments   Labs (all labs ordered are listed, but only abnormal results are displayed) Labs Reviewed - No data to display  EKG None  Radiology No results found.  Procedures Procedures (including critical care time)  Medications Ordered in ED Medications  fluorescein ophthalmic strip 1 strip (has no administration in time range)  tetracaine (PONTOCAINE) 0.5 % ophthalmic solution 1 drop (has no administration in time range)    ED Course  I have reviewed the triage vital signs and the nursing notes.  Pertinent labs & imaging results that were available during my care of the patient were reviewed by me and considered in my medical decision making (see chart for details).    MDM Rules/Calculators/A&P                          19 year old female presenting to the ED with right eye lid swelling, drainage and erythema.  She denies any fevers, pain with EOMs, neck stiffness or trauma at this time.  Fluorescein exam without corneal abrasion or other abnormalities.  She has no proptosis or tenderness around her eye.  EOMs are intact.  Visual acuity is 20/50 bilaterally.  Due to physical exam findings and patient's history will treat with Augmentin for possible preseptal cellulitis.  Advised importance assessing for improvement in 24 to 48 hours.  She is not  currently followed by and ophthalmologist so we will refer her to on-call ophthalmologist.  Advised to return here for any worsening symptoms beyond 24 to 48 hours.  Patient is agreeable to the plan. She denies possibility of pregnancy.   Patient is hemodynamically stable, in NAD, and able to ambulate in the ED. Evaluation does not show pathology that would require ongoing emergent intervention or inpatient treatment. I explained the diagnosis to the patient. Pain has been managed and has no complaints prior to discharge. Patient is comfortable with above plan and is stable for discharge at this time. All questions were answered prior to disposition. Strict return precautions for returning to the ED were discussed. Encouraged follow up with PCP.   An After Visit  Summary was printed and given to the patient.   Portions of this note were generated with Scientist, clinical (histocompatibility and immunogenetics). Dictation errors may occur despite best attempts at proofreading.  Final Clinical Impression(s) / ED Diagnoses Final diagnoses:  Periorbital swelling    Rx / DC Orders ED Discharge Orders         Ordered    amoxicillin-clavulanate (AUGMENTIN) 875-125 MG tablet  Every 12 hours        11/12/19 0721           Dietrich Pates, PA-C 11/12/19 0728    Wynetta Fines, MD 11/15/19 [phone]
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
## 2021-04-16 ENCOUNTER — Emergency Department (HOSPITAL_COMMUNITY): Payer: Self-pay

## 2021-04-16 ENCOUNTER — Other Ambulatory Visit: Payer: Self-pay

## 2021-04-16 ENCOUNTER — Encounter (HOSPITAL_COMMUNITY): Payer: Self-pay | Admitting: Emergency Medicine

## 2021-04-16 ENCOUNTER — Emergency Department (HOSPITAL_COMMUNITY)
Admission: EM | Admit: 2021-04-16 | Discharge: 2021-04-17 | Disposition: A | Discharge status: Home / Self Care | Payer: Self-pay | Attending: Emergency Medicine | Admitting: Emergency Medicine

## 2021-04-16 DIAGNOSIS — S0181XA Laceration without foreign body of other part of head, initial encounter: Secondary | ICD-10-CM

## 2021-04-16 DIAGNOSIS — S032XXA Dislocation of tooth, initial encounter: Secondary | ICD-10-CM | POA: Diagnosis not present

## 2021-04-16 DIAGNOSIS — S80212A Abrasion, left knee, initial encounter: Secondary | ICD-10-CM | POA: Insufficient documentation

## 2021-04-16 DIAGNOSIS — S80211A Abrasion, right knee, initial encounter: Secondary | ICD-10-CM | POA: Diagnosis not present

## 2021-04-16 DIAGNOSIS — S50811A Abrasion of right forearm, initial encounter: Secondary | ICD-10-CM | POA: Insufficient documentation

## 2021-04-16 DIAGNOSIS — Z23 Encounter for immunization: Secondary | ICD-10-CM | POA: Diagnosis not present

## 2021-04-16 DIAGNOSIS — S50812A Abrasion of left forearm, initial encounter: Secondary | ICD-10-CM | POA: Diagnosis not present

## 2021-04-16 DIAGNOSIS — T07XXXA Unspecified multiple injuries, initial encounter: Secondary | ICD-10-CM

## 2021-04-16 DIAGNOSIS — Y92410 Unspecified street and highway as the place of occurrence of the external cause: Secondary | ICD-10-CM | POA: Insufficient documentation

## 2021-04-16 DIAGNOSIS — S0990XA Unspecified injury of head, initial encounter: Secondary | ICD-10-CM | POA: Diagnosis present

## 2021-04-16 LAB — CBC
HCT: 39.9 % (ref 36.0–46.0)
Hemoglobin: 13.3 g/dL (ref 12.0–15.0)
MCH: 30 pg (ref 26.0–34.0)
MCHC: 33.3 g/dL (ref 30.0–36.0)
MCV: 90.1 fL (ref 80.0–100.0)
Platelets: 237 10*3/uL (ref 150–400)
RBC: 4.43 MIL/uL (ref 3.87–5.11)
RDW: 13.3 % (ref 11.5–15.5)
WBC: 5.1 10*3/uL (ref 4.0–10.5)
nRBC: 0 % (ref 0.0–0.2)

## 2021-04-16 LAB — COMPREHENSIVE METABOLIC PANEL
ALT: 19 U/L (ref 0–44)
AST: 24 U/L (ref 15–41)
Albumin: 3.6 g/dL (ref 3.5–5.0)
Alkaline Phosphatase: 47 U/L (ref 38–126)
Anion gap: 7 (ref 5–15)
BUN: 9 mg/dL (ref 6–20)
CO2: 24 mmol/L (ref 22–32)
Calcium: 9.1 mg/dL (ref 8.9–10.3)
Chloride: 108 mmol/L (ref 98–111)
Creatinine, Ser: 0.76 mg/dL (ref 0.44–1.00)
GFR, Estimated: >60 mL/min (ref >60)
Glucose, Bld: 108 mg/dL — ABNORMAL HIGH (ref 70–99)
Potassium: 3.8 mmol/L (ref 3.5–5.1)
Sodium: 139 mmol/L (ref 135–145)
Total Bilirubin: 0.4 mg/dL (ref 0.3–1.2)
Total Protein: 6.5 g/dL (ref 6.5–8.1)

## 2021-04-16 LAB — I-STAT CHEM 8, ED
BUN: 9 mg/dL (ref 6–20)
Calcium, Ion: 1.08 mmol/L — ABNORMAL LOW (ref 1.15–1.40)
Chloride: 105 mmol/L (ref 98–111)
Creatinine, Ser: 0.7 mg/dL (ref 0.44–1.00)
Glucose, Bld: 108 mg/dL — ABNORMAL HIGH (ref 70–99)
HCT: 40 % (ref 36.0–46.0)
Hemoglobin: 13.6 g/dL (ref 12.0–15.0)
Potassium: 3.8 mmol/L (ref 3.5–5.1)
Sodium: 139 mmol/L (ref 135–145)
TCO2: 25 mmol/L (ref 22–32)

## 2021-04-16 LAB — I-STAT BETA HCG BLOOD, ED (MC, WL, AP ONLY): I-stat hCG, quantitative: <5 m[IU]/mL (ref <5)

## 2021-04-16 LAB — ETHANOL: Alcohol, Ethyl (B): <10 mg/dL (ref <10)

## 2021-04-16 MED ORDER — TETANUS-DIPHTH-ACELL PERTUSSIS 5-2.5-18.5 LF-MCG/0.5 IM SUSY
0.5000 mL | PREFILLED_SYRINGE | Freq: Once | INTRAMUSCULAR | Status: AC
  Administered 2021-04-17: 0.5 mL via INTRAMUSCULAR
  Filled 2021-04-16: qty 0.5

## 2021-04-16 MED ORDER — LIDOCAINE-EPINEPHRINE (PF) 2 %-1:200000 IJ SOLN
20.0000 mL | Freq: Once | INTRAMUSCULAR | Status: AC
  Administered 2021-04-17: 20 mL
  Filled 2021-04-16: qty 20
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
## 2021-04-17 ENCOUNTER — Emergency Department (HOSPITAL_COMMUNITY): Payer: Self-pay

## 2021-04-17 MED ORDER — IBUPROFEN 800 MG PO TABS: 800.0000 mg | ORAL_TABLET | Freq: Three times a day (TID) | ORAL | 0 refills | Status: DC | PRN

## 2021-04-17 MED ORDER — TIZANIDINE HCL 4 MG PO TABS
4.0000 mg | ORAL_TABLET | Freq: Four times a day (QID) | ORAL | 0 refills | Status: DC | PRN
Start: 2021-04-17 — End: 2022-09-11

## 2021-04-17 MED ORDER — KETOROLAC TROMETHAMINE 60 MG/2ML IM SOLN
60.0000 mg | Freq: Once | INTRAMUSCULAR | Status: AC
  Administered 2021-04-17: 60 mg via INTRAMUSCULAR
  Filled 2021-04-17: qty 2

## 2021-04-17 NOTE — ED Notes (Signed)
Pt ambulatory w no assist around room. Voiced no increased pain/discomfort, gait steady & even. EDP notified ?

## 2021-04-17 NOTE — Discharge Instructions (Addendum)
Your sutures will need to be removed in the next 5-7 days.  You can go to Urgent Care, your Family Doctor or the Emergency Department for suture removal.   ? ?

## 2022-09-08 ENCOUNTER — Other Ambulatory Visit: Payer: Self-pay

## 2022-09-08 ENCOUNTER — Emergency Department (HOSPITAL_COMMUNITY)
Admission: EM | Admit: 2022-09-08 | Discharge: 2022-09-08 | Disposition: A | Discharge status: Home / Self Care | Payer: Medicaid Other | Attending: Emergency Medicine | Admitting: Emergency Medicine

## 2022-09-08 ENCOUNTER — Encounter (HOSPITAL_COMMUNITY): Payer: Self-pay

## 2022-09-08 DIAGNOSIS — N179 Acute kidney failure, unspecified: Secondary | ICD-10-CM | POA: Insufficient documentation

## 2022-09-08 DIAGNOSIS — K529 Noninfective gastroenteritis and colitis, unspecified: Secondary | ICD-10-CM | POA: Insufficient documentation

## 2022-09-08 LAB — URINALYSIS, ROUTINE W REFLEX MICROSCOPIC
Bilirubin Urine: NEGATIVE
Glucose, UA: NEGATIVE mg/dL
Hgb urine dipstick: NEGATIVE
Ketones, ur: NEGATIVE mg/dL
Leukocytes,Ua: NEGATIVE
Nitrite: NEGATIVE
Protein, ur: NEGATIVE mg/dL
Specific Gravity, Urine: 1.008 (ref 1.005–1.030)
pH: 6 (ref 5.0–8.0)

## 2022-09-08 LAB — MAGNESIUM: Magnesium: 2 mg/dL (ref 1.7–2.4)

## 2022-09-08 LAB — CBC
HCT: 50.6 % — ABNORMAL HIGH (ref 36.0–46.0)
Hemoglobin: 16.3 g/dL — ABNORMAL HIGH (ref 12.0–15.0)
MCH: 29.4 pg (ref 26.0–34.0)
MCHC: 32.2 g/dL (ref 30.0–36.0)
MCV: 91.2 fL (ref 80.0–100.0)
Platelets: 302 10*3/uL (ref 150–400)
RBC: 5.55 MIL/uL — ABNORMAL HIGH (ref 3.87–5.11)
RDW: 13.5 % (ref 11.5–15.5)
WBC: 7.5 10*3/uL (ref 4.0–10.5)
nRBC: 0 % (ref 0.0–0.2)

## 2022-09-08 LAB — COMPREHENSIVE METABOLIC PANEL
ALT: 13 U/L (ref 0–44)
AST: 14 U/L — ABNORMAL LOW (ref 15–41)
Albumin: 3.1 g/dL — ABNORMAL LOW (ref 3.5–5.0)
Alkaline Phosphatase: 38 U/L (ref 38–126)
Anion gap: 12 (ref 5–15)
BUN: 7 mg/dL (ref 6–20)
CO2: 22 mmol/L (ref 22–32)
Calcium: 8.5 mg/dL — ABNORMAL LOW (ref 8.9–10.3)
Chloride: 106 mmol/L (ref 98–111)
Creatinine, Ser: 1.35 mg/dL — ABNORMAL HIGH (ref 0.44–1.00)
GFR, Estimated: 56 mL/min — ABNORMAL LOW (ref >60)
Glucose, Bld: 163 mg/dL — ABNORMAL HIGH (ref 70–99)
Potassium: 3.9 mmol/L (ref 3.5–5.1)
Sodium: 140 mmol/L (ref 135–145)
Total Bilirubin: 0.8 mg/dL (ref 0.3–1.2)
Total Protein: 5.9 g/dL — ABNORMAL LOW (ref 6.5–8.1)

## 2022-09-08 LAB — LIPASE, BLOOD: Lipase: 23 U/L (ref 11–51)

## 2022-09-08 LAB — HCG, SERUM, QUALITATIVE: Preg, Serum: NEGATIVE

## 2022-09-08 LAB — TROPONIN I (HIGH SENSITIVITY)
Troponin I (High Sensitivity): 3 ng/L (ref <18)
Troponin I (High Sensitivity): <2 ng/L (ref <18)

## 2022-09-08 MED ORDER — ONDANSETRON HCL 4 MG PO TABS: 4.0000 mg | ORAL_TABLET | Freq: Four times a day (QID) | ORAL | 0 refills | Status: DC

## 2022-09-08 MED ORDER — LACTATED RINGERS IV BOLUS
1000.0000 mL | Freq: Once | INTRAVENOUS | Status: AC
  Administered 2022-09-08: 1000 mL via INTRAVENOUS

## 2022-09-08 MED ORDER — ACETAMINOPHEN 325 MG PO TABS
650.0000 mg | ORAL_TABLET | Freq: Once | ORAL | Status: AC
  Administered 2022-09-08: 650 mg via ORAL
  Filled 2022-09-08: qty 2

## 2022-09-08 MED ORDER — LOPERAMIDE HCL 2 MG PO CAPS: 2.0000 mg | ORAL_CAPSULE | Freq: Four times a day (QID) | ORAL | 0 refills | Status: DC | PRN

## 2022-09-08 MED ORDER — ONDANSETRON HCL 4 MG/2ML IJ SOLN
4.0000 mg | Freq: Once | INTRAMUSCULAR | Status: AC
  Administered 2022-09-08: 4 mg via INTRAVENOUS
  Filled 2022-09-08: qty 2

## 2022-09-08 NOTE — ED Notes (Signed)
Dr. Theresia Lo made aware of pt's vital signs. Verbal instructions to place fluids in pressure bag and repeat EKG.

## 2022-09-08 NOTE — ED Triage Notes (Signed)
Pt arrives with c/o ABD pain that started about 2 weeks ago. Pt endorses decreased PO intake over the last 2 weeks. Per pt, she has had 5 syncopal episodes in the past 3-4 days. Pt is hypotensive in 80s in triage. Pt endorses n/v/d.Pt a&ox4 in triage.

## 2022-09-08 NOTE — ED Provider Notes (Signed)
[place] EMERGENCY DEPARTMENT AT [facility] Provider Note   CSN: [id number] Arrival date & time: 09/08/22  1846     History  Chief Complaint  Patient presents with   Abdominal Pain    Brittany Hoffman is a 22 y.o. female.  Patient is a 22 year old female with no significant past medical history presenting to the emergency department with abdominal pain and dizziness.  The patient states that she has had mild lower abdominal pain ongoing for the last 2 weeks.  She states that she has had associated nausea, vomiting and diarrhea.  She denies any black or bloody stools.  She reports dysuria but denies any hematuria or increased urinary frequency.  She denies any abnormal vaginal discharge but does endorse unprotected sex though with the same partner.  She states that over the last 4 days she started to have increasing dizziness and has nearly passed out at home.  She denies any associated chest pain.  She does report some mild shortness of breath.  She denies any vaginal bleeding.  She states her LMP was approximately 1 month ago.  The history is provided by the patient.  Abdominal Pain      Home Medications Prior to Admission medications   Medication Sig Start Date End Date Taking? Authorizing Provider  loperamide (IMODIUM) 2 MG capsule Take 1 capsule (2 mg total) by mouth 4 (four) times daily as needed for diarrhea or loose stools. 09/08/22  Yes Theresia Lo, [place] K, DO  ondansetron (ZOFRAN) 4 MG tablet Take 1 tablet (4 mg total) by mouth every 6 (six) hours. 09/08/22  Yes Theresia Lo, Turkey K, DO  adapalene (DIFFERIN) 0.1 % cream Apply topically at bedtime. to face (apply small amount)    [provider]  amoxicillin-clavulanate (AUGMENTIN) 875-125 MG tablet Take 1 tablet by mouth every 12 (twelve) hours. 11/12/19   Khatri, Hina, PA-C  bacitracin ointment Apply 1 application topically 2 (two) times daily. 06/30/18   Arby Barrette, MD  cephALEXin (KEFLEX) 500 MG capsule  Take 2 capsules (1,000 mg total) by mouth 2 (two) times daily. 06/30/18   Arby Barrette, MD  ciprofloxacin (CIPRO) 250 MG tablet Take 1 tablet (250 mg total) by mouth every 12 (twelve) hours. 12/24/16   Marny Lowenstein, PA-C  ibuprofen (ADVIL) 800 MG tablet Take 1 tablet (800 mg total) by mouth every 8 (eight) hours as needed for moderate pain. 04/17/21   Tilden Fossa, MD  metroNIDAZOLE (FLAGYL) 500 MG tablet Take 1 tablet (500 mg total) by mouth 2 (two) times daily. 07/09/18   Lorin Picket, NP  norgestimate-ethinyl estradiol (ORTHO-CYCLEN,SPRINTEC,PREVIFEM) 0.25-35 MG-MCG tablet Take 1 tablet by mouth daily. 11/20/14   Haney, Jeanann Lewandowsky, MD  tiZANidine (ZANAFLEX) 4 MG tablet Take 1 tablet (4 mg total) by mouth every 6 (six) hours as needed for muscle spasms. 04/17/21   Tilden Fossa, MD      Allergies    Patient has no known allergies.    Review of Systems   Review of Systems  Gastrointestinal:  Positive for abdominal pain.    Physical Exam Updated Vital Signs BP 107/76   Pulse (!) 41   Temp (!) 97.5 F (36.4 C) (Oral)   Resp 20   Wt 104.3 kg   SpO2 100%   BMI 31.19 kg/m  Physical Exam Vitals and nursing note reviewed.  Constitutional:      General: She is not in acute distress.    Appearance: She is well-developed.  HENT:  Head: Normocephalic and atraumatic.     Mouth/Throat:     Mouth: Mucous membranes are moist.  Eyes:     Extraocular Movements: Extraocular movements intact.  Cardiovascular:     Rate and Rhythm: Normal rate and regular rhythm.     Heart sounds: Normal heart sounds.  Pulmonary:     Effort: Pulmonary effort is normal.     Breath sounds: Normal breath sounds.  Abdominal:     General: Abdomen is flat.     Palpations: Abdomen is soft.     Tenderness: There is abdominal tenderness (Mild) in the suprapubic area. There is no right CVA tenderness, left CVA tenderness, guarding or rebound.  Skin:    General: Skin is warm and dry.  Neurological:      General: No focal deficit present.     Mental Status: She is alert and oriented to person, place, and time.  Psychiatric:        Mood and Affect: Mood normal.        Behavior: Behavior normal.     ED Results / Procedures / Treatments   Labs (all labs ordered are listed, but only abnormal results are displayed) Labs Reviewed  COMPREHENSIVE METABOLIC PANEL - Abnormal; Notable for the following components:      Result Value   Glucose, Bld 163 (*)    Creatinine, Ser 1.35 (*)    Calcium 8.5 (*)    Total Protein 5.9 (*)    Albumin 3.1 (*)    AST 14 (*)    GFR, Estimated 56 (*)    All other components within normal limits  CBC - Abnormal; Notable for the following components:   RBC 5.55 (*)    Hemoglobin 16.3 (*)    HCT 50.6 (*)    All other components within normal limits  LIPASE, BLOOD  URINALYSIS, ROUTINE W REFLEX MICROSCOPIC  HCG, SERUM, QUALITATIVE  MAGNESIUM  TROPONIN I (HIGH SENSITIVITY)  TROPONIN I (HIGH SENSITIVITY)    EKG EKG Interpretation Date/Time:  Monday September 08 2022 20:25:58 EDT Ventricular Rate:  39 PR Interval:  175 QRS Duration:  99 QT Interval:  509 QTC Calculation: 410 R Axis:   58  Text Interpretation: Sinus bradycardia Otherwise no significant change Confirmed by Elayne Snare ([id number]) on 09/08/2022 8:41:56 PM  Radiology No results found.  Procedures Procedures    Medications Ordered in ED Medications  lactated ringers bolus 1,000 mL (0 mLs Intravenous Stopped 09/08/22 2035)  ondansetron (ZOFRAN) injection 4 mg (4 mg Intravenous Given 09/08/22 2003)  acetaminophen (TYLENOL) tablet 650 mg (650 mg Oral Given 09/08/22 2009)  lactated ringers bolus 1,000 mL (0 mLs Intravenous Stopped 09/08/22 2125)    ED Course/ Medical Decision Making/ A&P Clinical Course as of 09/08/22 2220  Mon Sep 08, 2022  2032 Does have AKI on labs, bradycardic on the monitor but sleeping in the room. She's easily arousable to voice without any worsening pain. BP still  soft in the 80's to 90's, has not urinated. Will give 2nd liter of IVF. [VK]  2210 Upon reassessment, the patient is asleep in bed resting comfortably in no acute distress.  Urine is negative for UTI.  She was able to ambulate to the bathroom steadily without any symptoms.  She is stable for discharge home with outpatient follow-up and was given strict return precautions. [VK]    Clinical Course User Index [VK] Rexford Maus, DO  Medical Decision Making This patient presents to the ED with chief complaint(s) of dizziness, abd pain with no pertinent past medical history which further complicates the presenting complaint. The complaint involves an extensive differential diagnosis and also carries with it a high risk of complications and morbidity.    The differential diagnosis includes UTI, pregnancy, ectopic, gastroenteritis, pancreatitis, hepatitis, dehydration, electrolyte abnormality, STI/PID less likely is no abnormal discharge or new sexual partners  Additional history obtained: Additional history obtained from N/A Records reviewed N/A  ED Course and Reassessment: On patient's arrival to the emergency department she was initially hypotensive to the 80s over 50s and was immediately transported back to a room.  On her arrival to the room her blood pressure spontaneously improved to the 100s and she is well-appearing in no acute distress.  Patient will have workup including urine, urine pregnancy, labs.  EKG on arrival showed normal sinus rhythm without acute ischemic changes.  She will be given Tylenol, Zofran and fluids for symptomatic management and will be closely reassessed.  Independent labs interpretation:  The following labs were independently interpreted: Mild AKI, otherwise within normal range  Independent visualization of imaging: - N/A  Consultation: - Consulted or discussed management/test interpretation w/ external professional:  N/A  Consideration for admission or further workup: Patient has no emergent conditions requiring admission or further work-up at this time and is stable for discharge home with primary care follow-up  Social Determinants of health: N/A    Amount and/or Complexity of Data Reviewed Labs: ordered.  Risk OTC drugs. Prescription drug management.          Final Clinical Impression(s) / ED Diagnoses Final diagnoses:  Gastroenteritis  AKI (acute kidney injury) (HCC)    Rx / DC Orders ED Discharge Orders          Ordered    ondansetron (ZOFRAN) 4 MG tablet  Every 6 hours        09/08/22 2220    loperamide (IMODIUM) 2 MG capsule  4 times daily PRN        09/08/22 2220              Rexford Maus, DO 09/08/22 2221

## 2022-09-08 NOTE — Discharge Instructions (Signed)
You were seen in the emergency department for nausea, vomiting diarrhea and dizziness.  Your blood pressure was initially low on your arrival here and you did appear dehydrated.  Your kidney function was slightly above normal and we gave you fluids through the IV to help hydrate you.  You can take Zofran as needed for nausea and Imodium as needed for diarrhea.  You should make sure that you are drinking plenty of fluids and staying well-hydrated and you can follow-up with your primary doctor to have your symptoms and kidney function rechecked.  You should return to the emergency department if you are having continued dizziness or episodes of passing out, repetitive vomiting despite the nausea medicine, significantly worsening abdominal pain or dehydration or any other new or concerning symptoms.

## 2022-09-11 ENCOUNTER — Inpatient Hospital Stay (HOSPITAL_COMMUNITY)
Admission: EM | Admit: 2022-09-11 | Discharge: 2022-09-13 | DRG: 312 | Disposition: A | Discharge status: Home / Self Care | Payer: Self-pay | Attending: Internal Medicine | Admitting: Internal Medicine

## 2022-09-11 ENCOUNTER — Encounter (HOSPITAL_COMMUNITY): Payer: Self-pay

## 2022-09-11 ENCOUNTER — Emergency Department (HOSPITAL_COMMUNITY): Payer: Self-pay

## 2022-09-11 ENCOUNTER — Other Ambulatory Visit: Payer: Self-pay

## 2022-09-11 DIAGNOSIS — E669 Obesity, unspecified: Secondary | ICD-10-CM | POA: Diagnosis present

## 2022-09-11 DIAGNOSIS — Z8249 Family history of ischemic heart disease and other diseases of the circulatory system: Secondary | ICD-10-CM

## 2022-09-11 DIAGNOSIS — R197 Diarrhea, unspecified: Secondary | ICD-10-CM

## 2022-09-11 DIAGNOSIS — Z5982 Transportation insecurity: Secondary | ICD-10-CM

## 2022-09-11 DIAGNOSIS — Z6832 Body mass index (BMI) 32.0-32.9, adult: Secondary | ICD-10-CM

## 2022-09-11 DIAGNOSIS — E876 Hypokalemia: Secondary | ICD-10-CM | POA: Diagnosis present

## 2022-09-11 DIAGNOSIS — Z1152 Encounter for screening for COVID-19: Secondary | ICD-10-CM

## 2022-09-11 DIAGNOSIS — E860 Dehydration: Secondary | ICD-10-CM | POA: Diagnosis present

## 2022-09-11 DIAGNOSIS — R001 Bradycardia, unspecified: Secondary | ICD-10-CM

## 2022-09-11 DIAGNOSIS — R55 Syncope and collapse: Principal | ICD-10-CM | POA: Diagnosis present

## 2022-09-11 DIAGNOSIS — I441 Atrioventricular block, second degree: Secondary | ICD-10-CM | POA: Diagnosis present

## 2022-09-11 DIAGNOSIS — K760 Fatty (change of) liver, not elsewhere classified: Secondary | ICD-10-CM | POA: Diagnosis present

## 2022-09-11 DIAGNOSIS — Z79899 Other long term (current) drug therapy: Secondary | ICD-10-CM

## 2022-09-11 DIAGNOSIS — A09 Infectious gastroenteritis and colitis, unspecified: Secondary | ICD-10-CM | POA: Diagnosis present

## 2022-09-11 DIAGNOSIS — G901 Familial dysautonomia [Riley-Day]: Secondary | ICD-10-CM | POA: Diagnosis present

## 2022-09-11 DIAGNOSIS — K589 Irritable bowel syndrome without diarrhea: Secondary | ICD-10-CM | POA: Diagnosis present

## 2022-09-11 DIAGNOSIS — R1030 Lower abdominal pain, unspecified: Secondary | ICD-10-CM

## 2022-09-11 DIAGNOSIS — F17290 Nicotine dependence, other tobacco product, uncomplicated: Secondary | ICD-10-CM | POA: Diagnosis present

## 2022-09-11 DIAGNOSIS — I951 Orthostatic hypotension: Principal | ICD-10-CM | POA: Diagnosis present

## 2022-09-11 LAB — COMPREHENSIVE METABOLIC PANEL
ALT: 14 U/L (ref 0–44)
AST: 11 U/L — ABNORMAL LOW (ref 15–41)
Albumin: 3.2 g/dL — ABNORMAL LOW (ref 3.5–5.0)
Alkaline Phosphatase: 33 U/L — ABNORMAL LOW (ref 38–126)
Anion gap: 10 (ref 5–15)
BUN: 6 mg/dL (ref 6–20)
CO2: 24 mmol/L (ref 22–32)
Calcium: 8.3 mg/dL — ABNORMAL LOW (ref 8.9–10.3)
Chloride: 104 mmol/L (ref 98–111)
Creatinine, Ser: 1.03 mg/dL — ABNORMAL HIGH (ref 0.44–1.00)
GFR, Estimated: >60 mL/min (ref >60)
Glucose, Bld: 115 mg/dL — ABNORMAL HIGH (ref 70–99)
Potassium: 3.4 mmol/L — ABNORMAL LOW (ref 3.5–5.1)
Sodium: 138 mmol/L (ref 135–145)
Total Bilirubin: 0.5 mg/dL (ref 0.3–1.2)
Total Protein: 5.6 g/dL — ABNORMAL LOW (ref 6.5–8.1)

## 2022-09-11 LAB — LIPASE, BLOOD: Lipase: 27 U/L (ref 11–51)

## 2022-09-11 LAB — RESP PANEL BY RT-PCR (RSV, FLU A&B, COVID)  RVPGX2
Influenza A by PCR: NEGATIVE
Influenza B by PCR: NEGATIVE
Resp Syncytial Virus by PCR: NEGATIVE
SARS Coronavirus 2 by RT PCR: NEGATIVE

## 2022-09-11 LAB — CBC WITH DIFFERENTIAL/PLATELET
Abs Immature Granulocytes: 0.03 10*3/uL (ref 0.00–0.07)
Basophils Absolute: 0 10*3/uL (ref 0.0–0.1)
Basophils Relative: 1 %
Eosinophils Absolute: 0.2 10*3/uL (ref 0.0–0.5)
Eosinophils Relative: 2 %
HCT: 47 % — ABNORMAL HIGH (ref 36.0–46.0)
Hemoglobin: 15.3 g/dL — ABNORMAL HIGH (ref 12.0–15.0)
Immature Granulocytes: 0 %
Lymphocytes Relative: 25 %
Lymphs Abs: 2.1 10*3/uL (ref 0.7–4.0)
MCH: 29.7 pg (ref 26.0–34.0)
MCHC: 32.6 g/dL (ref 30.0–36.0)
MCV: 91.3 fL (ref 80.0–100.0)
Monocytes Absolute: 0.6 10*3/uL (ref 0.1–1.0)
Monocytes Relative: 7 %
Neutro Abs: 5.4 10*3/uL (ref 1.7–7.7)
Neutrophils Relative %: 65 %
Platelets: 283 10*3/uL (ref 150–400)
RBC: 5.15 MIL/uL — ABNORMAL HIGH (ref 3.87–5.11)
RDW: 13.2 % (ref 11.5–15.5)
WBC: 8.4 10*3/uL (ref 4.0–10.5)
nRBC: 0 % (ref 0.0–0.2)

## 2022-09-11 LAB — HCG, QUANTITATIVE, PREGNANCY: hCG, Beta Chain, Quant, S: <1 m[IU]/mL (ref <5)

## 2022-09-11 LAB — I-STAT CG4 LACTIC ACID, ED: Lactic Acid, Venous: 1.2 mmol/L (ref 0.5–1.9)

## 2022-09-11 MED ORDER — SODIUM CHLORIDE 0.9 % IV BOLUS
1000.0000 mL | Freq: Once | INTRAVENOUS | Status: AC
  Administered 2022-09-11: 1000 mL via INTRAVENOUS

## 2022-09-11 MED ORDER — LACTATED RINGERS IV BOLUS
1000.0000 mL | Freq: Once | INTRAVENOUS | Status: AC
  Administered 2022-09-12: 1000 mL via INTRAVENOUS

## 2022-09-11 MED ORDER — POTASSIUM CHLORIDE CRYS ER 20 MEQ PO TBCR
20.0000 meq | EXTENDED_RELEASE_TABLET | Freq: Once | ORAL | Status: AC
  Administered 2022-09-11: 20 meq via ORAL
  Filled 2022-09-11: qty 1

## 2022-09-11 MED ORDER — ONDANSETRON HCL 4 MG/2ML IJ SOLN
4.0000 mg | Freq: Once | INTRAMUSCULAR | Status: AC
  Administered 2022-09-12: 4 mg via INTRAVENOUS
  Filled 2022-09-11: qty 2

## 2022-09-11 MED ORDER — IOHEXOL 300 MG/ML  SOLN
100.0000 mL | Freq: Once | INTRAMUSCULAR | Status: AC | PRN
  Administered 2022-09-11: 100 mL via INTRAVENOUS

## 2022-09-11 NOTE — ED Triage Notes (Addendum)
Pt reports passing out today when walking around. Pt reports not being able to keep anything down today. Pt initially started feeling bad 2 days ago. Pt states that her period started today.

## 2022-09-11 NOTE — ED Provider Notes (Signed)
[place] EMERGENCY DEPARTMENT AT [facility] Provider Note   CSN: [id number] Arrival date & time: 09/11/22  2029     History  Chief Complaint  Patient presents with   Loss of Consciousness    Brittany Hoffman is a 22 y.o. female.  22 year old female present emergency department with abdominal pain x 3 weeks with nausea vomiting.  Presenting today for syncopal episodes.  Reports that she has passed out 3 times or so.  They happen when she stands up.  Reports she has had decreased p.o. intake for the past several days secondary to her nausea vomiting.  Notes that she has had some crampy periumbilical/epigastric abdominal pain for the past 2 to 3 weeks.    Loss of Consciousness      Home Medications Prior to Admission medications   Medication Sig Start Date End Date Taking? Authorizing Provider  ondansetron (ZOFRAN) 4 MG tablet Take 1 tablet (4 mg total) by mouth every 6 (six) hours. 09/08/22  Yes Elayne Snare K, DO  loperamide (IMODIUM) 2 MG capsule Take 1 capsule (2 mg total) by mouth 4 (four) times daily as needed for diarrhea or loose stools. Patient not taking: Reported on 09/11/2022 09/08/22   Elayne Snare K, DO      Allergies    Patient has no known allergies.    Review of Systems   Review of Systems  Cardiovascular:  Positive for syncope.    Physical Exam Updated Vital Signs BP 91/61 (BP Location: Left Arm)   Pulse (!) 42   Temp 97.7 F (36.5 C) (Oral)   Resp 17   SpO2 100%  Physical Exam Vitals and nursing note reviewed.  Constitutional:      General: She is not in acute distress.    Appearance: She is not toxic-appearing.  HENT:     Head: Normocephalic and atraumatic.     Nose: Nose normal.     Mouth/Throat:     Mouth: Mucous membranes are dry.  Eyes:     Conjunctiva/sclera: Conjunctivae normal.  Cardiovascular:     Rate and Rhythm: Regular rhythm. Bradycardia present.  Pulmonary:     Effort: Pulmonary effort is normal.     Breath  sounds: Normal breath sounds. No wheezing, rhonchi or rales.  Abdominal:     General: Abdomen is flat. There is no distension.     Tenderness: There is abdominal tenderness (mild epigastric). There is no guarding or rebound.  Musculoskeletal:        General: Normal range of motion.     Cervical back: Normal range of motion.  Skin:    General: Skin is warm and dry.     Capillary Refill: Capillary refill takes less than 2 seconds.  Neurological:     General: No focal deficit present.     Mental Status: She is alert.  Psychiatric:        Mood and Affect: Mood normal.        Behavior: Behavior normal.     ED Results / Procedures / Treatments   Labs (all labs ordered are listed, but only abnormal results are displayed) Labs Reviewed  CBC WITH DIFFERENTIAL/PLATELET - Abnormal; Notable for the following components:      Result Value   RBC 5.15 (*)    Hemoglobin 15.3 (*)    HCT 47.0 (*)    All other components within normal limits  COMPREHENSIVE METABOLIC PANEL - Abnormal; Notable for the following components:   Potassium 3.4 (*)  Glucose, Bld 115 (*)    Creatinine, Ser 1.03 (*)    Calcium 8.3 (*)    Total Protein 5.6 (*)    Albumin 3.2 (*)    AST 11 (*)    Alkaline Phosphatase 33 (*)    All other components within normal limits  RESP PANEL BY RT-PCR (RSV, FLU A&B, COVID)  RVPGX2  LIPASE, BLOOD  HCG, QUANTITATIVE, PREGNANCY  I-STAT CG4 LACTIC ACID, ED    EKG None  Radiology CT ABDOMEN PELVIS W CONTRAST  Result Date: 09/11/2022 CLINICAL DATA:  Abdominal pain, umbilical pain, nausea, vomiting EXAM: CT ABDOMEN AND PELVIS WITH CONTRAST TECHNIQUE: Multidetector CT imaging of the abdomen and pelvis was performed using the standard protocol following bolus administration of intravenous contrast. RADIATION DOSE REDUCTION: This exam was performed according to the departmental dose-optimization program which includes automated exposure control, adjustment of the mA and/or kV  according to patient size and/or use of iterative reconstruction technique. CONTRAST:  OMNIPAQUE IOHEXOL 300 MG/ML  SOLN COMPARISON:  CT abdomen and pelvis 07/06/2018 FINDINGS: Lower chest: No acute abnormality. Hepatobiliary: Hepatic steatosis. Normal gallbladder. No biliary dilation. Pancreas: Unremarkable. Spleen: Unremarkable. Adrenals/Urinary Tract: Normal adrenal glands. No urinary calculi or hydronephrosis. Bladder is unremarkable. Stomach/Bowel: Normal caliber large and small bowel. No bowel wall thickening. The appendix is normal.Stomach is within normal limits. Vascular/Lymphatic: No significant vascular findings are present. No enlarged abdominal or pelvic lymph nodes. Reproductive: Unremarkable. Other: Small volume free fluid in the pelvis, favored physiologic. No free intraperitoneal air. Musculoskeletal: No acute fracture. IMPRESSION: No acute abnormality in the abdomen or pelvis. Hepatic steatosis. Electronically Signed   By: Minerva Fester M.D.   On: 09/11/2022 23:00   DG Chest Portable 1 View  Result Date: 09/11/2022 CLINICAL DATA:  Syncope EXAM: PORTABLE CHEST 1 VIEW COMPARISON:  04/16/2021 FINDINGS: The heart size and mediastinal contours are within normal limits. Both lungs are clear. The visualized skeletal structures are unremarkable. IMPRESSION: Normal study. Electronically Signed   By: Charlett Nose M.D.   On: 09/11/2022 22:19    Procedures Procedures    Medications Ordered in ED Medications  ondansetron [facility]) injection 4 mg (has no administration in time range)  sodium chloride 0.9 % bolus 1,000 mL (0 mLs Intravenous Stopped 09/11/22 2307)  potassium chloride SA (KLOR-CON M) CR tablet 20 mEq (20 mEq Oral Given 09/11/22 2217)  iohexol (OMNIPAQUE) 300 MG/ML solution 100 mL (100 mLs Intravenous Contrast Given 09/11/22 2235)    ED Course/ Medical Decision Making/ A&P Clinical Course as of 09/11/22 2336  Thu Sep 11, 2022  2305 CT ABDOMEN PELVIS W CONTRAST IMPRESSION: No  acute abnormality in the abdomen or pelvis.  Hepatic steatosis.   [TY]  2307 ED EKG EKG appears to be sinus bradycardia at a rate of 59 bpm. normal intervals, no high degree AV block.  No QTc prolongation.  No ST segment changes to get ischemia. [TY]  2332 Blood pressure currently 111/85.  Heart rate still in the 40s appears to be sinus rhythm on the monitor on my independent interpretation.  Patient reports that she is feeling improved.  She was able to ambulate to the bathroom with little symptoms.  Will get orthostatic vitals. [TY]    Clinical Course User Index [TY] Coral Spikes, DO                                 Medical Decision Making 22 year old female present  emergency department for syncope in the setting of decreased p.o. intake due to nausea vomiting.  Bradycardic.  Blood pressure soft.  Blood pressure improved with IV fluids.  EKG appears to be sinus rhythm, but bradycardic.  Not appear to have ST segment changes to get ischemia.  No AV blocks or arrhythmias.  She has mild hypokalemia.  Which was repleted.  No fever or leukocytosis to suggest systemic infection.  Flu COVID-negative.  Lipase negative; pancreatitis unlikely cause of her nausea vomiting.  Beta-hCG negative.  She is not pregnant.  Lactic is not elevated; mesenteric ischemia unlikely.  Given patient return visit obtain CT abdomen; however no acute pathology noted.  Pending orthostatic vital signs, po challenge and reevaluation.  Amount and/or Complexity of Data Reviewed Labs: ordered. Radiology: ordered. Decision-making details documented in ED Course.  Risk Prescription drug management.  \        Final Clinical Impression(s) / ED Diagnoses Final diagnoses:  None    Rx / DC Orders ED Discharge Orders     None         Coral Spikes, DO 09/11/22 2336

## 2022-09-12 ENCOUNTER — Inpatient Hospital Stay (HOSPITAL_COMMUNITY): Payer: Self-pay

## 2022-09-12 DIAGNOSIS — R55 Syncope and collapse: Principal | ICD-10-CM | POA: Diagnosis present

## 2022-09-12 DIAGNOSIS — R1030 Lower abdominal pain, unspecified: Secondary | ICD-10-CM

## 2022-09-12 DIAGNOSIS — R001 Bradycardia, unspecified: Secondary | ICD-10-CM

## 2022-09-12 DIAGNOSIS — R197 Diarrhea, unspecified: Secondary | ICD-10-CM

## 2022-09-12 LAB — CBC
HCT: 42.7 % (ref 36.0–46.0)
HCT: 46 % (ref 36.0–46.0)
Hemoglobin: 13.7 g/dL (ref 12.0–15.0)
Hemoglobin: 15.4 g/dL — ABNORMAL HIGH (ref 12.0–15.0)
MCH: 29.8 pg (ref 26.0–34.0)
MCH: 30.6 pg (ref 26.0–34.0)
MCHC: 32.1 g/dL (ref 30.0–36.0)
MCHC: 33.5 g/dL (ref 30.0–36.0)
MCV: 92.8 fL (ref 80.0–100.0)
MCV: 91.3 fL (ref 80.0–100.0)
Platelets: 246 10*3/uL (ref 150–400)
Platelets: 214 10*3/uL (ref 150–400)
RBC: 4.6 MIL/uL (ref 3.87–5.11)
RBC: 5.04 MIL/uL (ref 3.87–5.11)
RDW: 13.2 % (ref 11.5–15.5)
RDW: 13.2 % (ref 11.5–15.5)
WBC: 8.9 10*3/uL (ref 4.0–10.5)
WBC: 8.8 10*3/uL (ref 4.0–10.5)
nRBC: 0 % (ref 0.0–0.2)
nRBC: 0 % (ref 0.0–0.2)

## 2022-09-12 LAB — BASIC METABOLIC PANEL
Anion gap: 8 (ref 5–15)
Anion gap: 9 (ref 5–15)
BUN: 6 mg/dL (ref 6–20)
BUN: <5 mg/dL — ABNORMAL LOW (ref 6–20)
CO2: 25 mmol/L (ref 22–32)
CO2: 24 mmol/L (ref 22–32)
Calcium: 8.4 mg/dL — ABNORMAL LOW (ref 8.9–10.3)
Calcium: 8.4 mg/dL — ABNORMAL LOW (ref 8.9–10.3)
Chloride: 106 mmol/L (ref 98–111)
Chloride: 105 mmol/L (ref 98–111)
Creatinine, Ser: 1.02 mg/dL — ABNORMAL HIGH (ref 0.44–1.00)
Creatinine, Ser: 0.92 mg/dL (ref 0.44–1.00)
GFR, Estimated: >60 mL/min (ref >60)
GFR, Estimated: >60 mL/min (ref >60)
Glucose, Bld: 140 mg/dL — ABNORMAL HIGH (ref 70–99)
Glucose, Bld: 101 mg/dL — ABNORMAL HIGH (ref 70–99)
Potassium: 4.1 mmol/L (ref 3.5–5.1)
Potassium: 3.9 mmol/L (ref 3.5–5.1)
Sodium: 139 mmol/L (ref 135–145)
Sodium: 138 mmol/L (ref 135–145)

## 2022-09-12 LAB — ECHOCARDIOGRAM COMPLETE
AR max vel: 2.8 cm2
AV Area VTI: 2.43 cm2
AV Area mean vel: 2.59 cm2
AV Mean grad: 4 mmHg
AV Peak grad: 8.6 mmHg
Ao pk vel: 1.47 m/s
Area-P 1/2: 2.83 cm2
Height: 71 in
S' Lateral: 3.1 cm
Weight: 3710.78 [oz_av]

## 2022-09-12 LAB — HIV ANTIBODY (ROUTINE TESTING W REFLEX)
HIV Screen 4th Generation wRfx: NONREACTIVE
HIV Screen 4th Generation wRfx: NONREACTIVE

## 2022-09-12 LAB — PHOSPHORUS
Phosphorus: 4 mg/dL (ref 2.5–4.6)
Phosphorus: 4.2 mg/dL (ref 2.5–4.6)

## 2022-09-12 LAB — MAGNESIUM
Magnesium: 1.7 mg/dL (ref 1.7–2.4)
Magnesium: 1.7 mg/dL (ref 1.7–2.4)

## 2022-09-12 LAB — CORTISOL: Cortisol, Plasma: 9.5 ug/dL

## 2022-09-12 LAB — TSH: TSH: 1.566 u[IU]/mL (ref 0.350–4.500)

## 2022-09-12 MED ORDER — PROCHLORPERAZINE EDISYLATE 10 MG/2ML IJ SOLN: 5.0000 mg | Freq: Four times a day (QID) | INTRAMUSCULAR | Status: DC | PRN

## 2022-09-12 MED ORDER — POLYETHYLENE GLYCOL 3350 17 G PO PACK: 17.0000 g | PACK | Freq: Every day | ORAL | Status: DC | PRN

## 2022-09-12 MED ORDER — ORAL CARE MOUTH RINSE: 15.0000 mL | OROMUCOSAL | Status: DC | PRN

## 2022-09-12 MED ORDER — LACTATED RINGERS IV BOLUS
1000.0000 mL | Freq: Once | INTRAVENOUS | Status: AC
  Administered 2022-09-12: 1000 mL via INTRAVENOUS

## 2022-09-12 MED ORDER — POTASSIUM CHLORIDE CRYS ER 20 MEQ PO TBCR
20.0000 meq | EXTENDED_RELEASE_TABLET | Freq: Once | ORAL | Status: AC
  Administered 2022-09-12: 20 meq via ORAL
  Filled 2022-09-12: qty 1

## 2022-09-12 MED ORDER — LACTATED RINGERS IV SOLN: INTRAVENOUS | Status: DC

## 2022-09-12 MED ORDER — ACETAMINOPHEN 325 MG PO TABS
650.0000 mg | ORAL_TABLET | Freq: Four times a day (QID) | ORAL | Status: DC | PRN
  Administered 2022-09-12: 650 mg via ORAL
  Filled 2022-09-12: qty 2

## 2022-09-12 MED ORDER — FAMOTIDINE 20 MG PO TABS
20.0000 mg | ORAL_TABLET | Freq: Every day | ORAL | Status: DC
  Administered 2022-09-12 – 2022-09-13 (×2): 20 mg via ORAL
  Filled 2022-09-12 (×2): qty 1

## 2022-09-12 MED ORDER — POTASSIUM CHLORIDE CRYS ER 20 MEQ PO TBCR
40.0000 meq | EXTENDED_RELEASE_TABLET | ORAL | Status: AC
  Administered 2022-09-12: 40 meq via ORAL
  Filled 2022-09-12: qty 2

## 2022-09-12 MED ORDER — MELATONIN 5 MG PO TABS: 5.0000 mg | ORAL_TABLET | Freq: Every evening | ORAL | Status: DC | PRN

## 2022-09-12 MED ORDER — MAGNESIUM SULFATE 4 GM/100ML IV SOLN
4.0000 g | Freq: Once | INTRAVENOUS | Status: AC
  Administered 2022-09-12: 4 g via INTRAVENOUS
  Filled 2022-09-12: qty 100

## 2022-09-12 MED ORDER — ENOXAPARIN SODIUM 40 MG/0.4ML IJ SOSY
40.0000 mg | PREFILLED_SYRINGE | INTRAMUSCULAR | Status: DC
  Administered 2022-09-12 – 2022-09-13 (×2): 40 mg via SUBCUTANEOUS
  Filled 2022-09-12 (×2): qty 0.4

## 2022-09-12 MED ORDER — ATROPINE SULFATE 1 MG/10ML IJ SOSY
0.4000 mg | PREFILLED_SYRINGE | INTRAMUSCULAR | Status: DC | PRN
  Administered 2022-09-12: 0.4 mg via INTRAVENOUS
  Filled 2022-09-12 (×2): qty 10

## 2022-09-12 MED ORDER — NICOTINE 14 MG/24HR TD PT24: 14.0000 mg | MEDICATED_PATCH | Freq: Every day | TRANSDERMAL | Status: DC | PRN

## 2022-09-12 NOTE — ED Notes (Signed)
FLOAT NURSE 

## 2022-09-12 NOTE — ED Notes (Signed)
..ED TO INPATIENT HANDOFF REPORT  ED Nurse Name and Phone #: [phone]  S Name/Age/Gender Gertie Exon 22 y.o. female Room/Bed: RESB/RESB  Code Status   Code Status: Not on file  Home/SNF/Other Home Patient oriented to: self, place, time, and situation Is this baseline? Yes   Triage Complete: Triage complete  Chief Complaint Syncope [R55]  Triage Note Pt reports passing out today when walking around. Pt reports not being able to keep anything down today. Pt initially started feeling bad 2 days ago. Pt states that her period started today.    Allergies No Known Allergies  Level of Care/Admitting Diagnosis ED Disposition     ED Disposition  Admit   Condition  --   Comment  Hospital Area: [facility] [[id number]]  Level of Care: Telemetry Cardiac [103]  May admit patient to [facility] or [facility] if equivalent level of care is available:: Yes  Covid Evaluation: Asymptomatic - no recent exposure (last 10 days) testing not required  Diagnosis: Syncope [206001]  Admitting Physician: Darlin Drop [[id number]]  Attending Physician: Darlin Drop [[id number]]  Certification:: I certify this patient will need inpatient services for at least 2 midnights  Expected Medical Readiness: 09/14/2022          B Medical/Surgery History Past Medical History:  Diagnosis Date   Known health problems: none    Past Surgical History:  Procedure Laterality Date   NO PAST SURGERIES       A IV Location/Drains/Wounds Patient Lines/Drains/Airways Status     Active Line/Drains/Airways     Name Placement date Placement time Site Days   Peripheral IV 09/11/22 20 G Right Antecubital 09/11/22  2057  Antecubital  1            Intake/Output Last 24 hours No intake or output data in the 24 hours ending 09/12/22 0156  Labs/Imaging Results for orders placed or performed during the hospital encounter of 09/11/22 (from the past 48 hour(s))  CBC with Differential      Status: Abnormal   Collection Time: 09/11/22  8:56 PM  Result Value Ref Range   WBC 8.4 4.0 - 10.5 K/uL   RBC 5.15 (H) 3.87 - 5.11 MIL/uL   Hemoglobin 15.3 (H) 12.0 - 15.0 g/dL   HCT 21.3 (H) 08.6 - 57.8 %   MCV 91.3 80.0 - 100.0 fL   MCH 29.7 26.0 - 34.0 pg   MCHC 32.6 30.0 - 36.0 g/dL   RDW 46.9 62.9 - 52.8 %   Platelets 283 150 - 400 K/uL   nRBC 0.0 0.0 - 0.2 %   Neutrophils Relative % 65 %   Neutro Abs 5.4 1.7 - 7.7 K/uL   Lymphocytes Relative 25 %   Lymphs Abs 2.1 0.7 - 4.0 K/uL   Monocytes Relative 7 %   Monocytes Absolute 0.6 0.1 - 1.0 K/uL   Eosinophils Relative 2 %   Eosinophils Absolute 0.2 0.0 - 0.5 K/uL   Basophils Relative 1 %   Basophils Absolute 0.0 0.0 - 0.1 K/uL   Immature Granulocytes 0 %   Abs Immature Granulocytes 0.03 0.00 - 0.07 K/uL    Comment: Performed at [facility], [address], [place], [place] [address]  Comprehensive metabolic panel     Status: Abnormal   Collection Time: 09/11/22  8:56 PM  Result Value Ref Range   Sodium 138 135 - 145 mmol/L   Potassium 3.4 (L) 3.5 - 5.1 mmol/L   Chloride 104  98 - 111 mmol/L   CO2 24 22 - 32 mmol/L   Glucose, Bld 115 (H) 70 - 99 mg/dL    Comment: Glucose reference range applies only to samples taken after fasting for at least 8 hours.   BUN 6 6 - 20 mg/dL   Creatinine, Ser 1.61 (H) 0.44 - 1.00 mg/dL   Calcium 8.3 (L) 8.9 - 10.3 mg/dL   Total Protein 5.6 (L) 6.5 - 8.1 g/dL   Albumin 3.2 (L) 3.5 - 5.0 g/dL   AST 11 (L) 15 - 41 U/L   ALT 14 0 - 44 U/L   Alkaline Phosphatase 33 (L) 38 - 126 U/L   Total Bilirubin 0.5 0.3 - 1.2 mg/dL   GFR, Estimated >09 >60 mL/min    Comment: (NOTE) Calculated using the CKD-EPI Creatinine Equation (2021)    Anion gap 10 5 - 15    Comment: Performed at [facility], [address] [place], [place] [address]  Resp panel by RT-PCR (RSV, Flu A&B, Covid) Anterior Nasal Swab     Status: None   Collection Time: 09/11/22  9:11 PM    Specimen: Anterior Nasal Swab  Result Value Ref Range   SARS Coronavirus 2 by RT PCR NEGATIVE NEGATIVE    Comment: (NOTE) SARS-CoV-2 target nucleic acids are NOT DETECTED.  The SARS-CoV-2 RNA is generally detectable in upper respiratory specimens during the acute phase of infection. The lowest concentration of SARS-CoV-2 viral copies this assay can detect is 138 copies/mL. A negative result does not preclude SARS-Cov-2 infection and should not be used as the sole basis for treatment or other patient management decisions. A negative result may occur with  improper specimen collection/handling, submission of specimen other than nasopharyngeal swab, presence of viral mutation(s) within the areas targeted by this assay, and inadequate number of viral copies(<138 copies/mL). A negative result must be combined with clinical observations, patient history, and epidemiological information. The expected result is Negative.  Fact Sheet for Patients:  BloggerCourse.com  Fact Sheet for Healthcare Providers:  SeriousBroker.it  This test is no t yet approved or cleared by the [place] FDA and  has been authorized for detection and/or diagnosis of SARS-CoV-2 by FDA under an Emergency Use Authorization (EUA). This EUA will remain  in effect (meaning this test can be used) for the duration of the COVID-19 declaration under Section 564(b)(1) of the Act, 21 U.S.C.section 360bbb-3(b)(1), unless the authorization is terminated  or revoked sooner.       Influenza A by PCR NEGATIVE NEGATIVE   Influenza B by PCR NEGATIVE NEGATIVE    Comment: (NOTE) The Xpert Xpress SARS-CoV-2/FLU/RSV plus assay is intended as an aid in the diagnosis of influenza from Nasopharyngeal swab specimens and should not be used as a sole basis for treatment. Nasal washings and aspirates are unacceptable for Xpert Xpress SARS-CoV-2/FLU/RSV testing.  Fact Sheet for  Patients: BloggerCourse.com  Fact Sheet for Healthcare Providers: SeriousBroker.it  This test is not yet approved or cleared by the [place] FDA and has been authorized for detection and/or diagnosis of SARS-CoV-2 by FDA under an Emergency Use Authorization (EUA). This EUA will remain in effect (meaning this test can be used) for the duration of the COVID-19 declaration under Section 564(b)(1) of the Act, 21 U.S.C. section 360bbb-3(b)(1), unless the authorization is terminated or revoked.     Resp Syncytial Virus by PCR NEGATIVE NEGATIVE    Comment: (NOTE) Fact Sheet for Patients: BloggerCourse.com  Fact Sheet for Healthcare Providers:  SeriousBroker.it  This test is not yet approved or cleared by the [place] and has been authorized for detection and/or diagnosis of SARS-CoV-2 by FDA under an Emergency Use Authorization (EUA). This EUA will remain in effect (meaning this test can be used) for the duration of the COVID-19 declaration under Section 564(b)(1) of the Act, 21 U.S.C. section 360bbb-3(b)(1), unless the authorization is terminated or revoked.  Performed at [facility], [address], [place], [place] [address]   Lipase, blood     Status: None   Collection Time: 09/11/22  9:16 PM  Result Value Ref Range   Lipase 27 11 - 51 U/L    Comment: Performed at [facility], [address] [place], [place] [address]  hCG, quantitative, pregnancy     Status: None   Collection Time: 09/11/22  9:16 PM  Result Value Ref Range   hCG, Beta Chain, Quant, S <1 <5 mIU/mL    Comment:          GEST. AGE      CONC.  (mIU/mL)   <=1 WEEK        5 - 50     2 WEEKS       50 - 500     3 WEEKS       100 - 10,000     4 WEEKS     1,000 - 30,000     5 WEEKS     3,500 - 115,000   6-8 WEEKS     12,000 - 270,000    12 WEEKS     15,000 -  220,000        FEMALE AND NON-PREGNANT FEMALE:     LESS THAN 5 mIU/mL Performed at [facility], [address] [place], [place] [address]   I-Stat Lactic Acid     Status: None   Collection Time: 09/11/22 10:18 PM  Result Value Ref Range   Lactic Acid, Venous 1.2 0.5 - 1.9 mmol/L   CT ABDOMEN PELVIS W CONTRAST  Result Date: 09/11/2022 CLINICAL DATA:  Abdominal pain, umbilical pain, nausea, vomiting EXAM: CT ABDOMEN AND PELVIS WITH CONTRAST TECHNIQUE: Multidetector CT imaging of the abdomen and pelvis was performed using the standard protocol following bolus administration of intravenous contrast. RADIATION DOSE REDUCTION: This exam was performed according to the departmental dose-optimization program which includes automated exposure control, adjustment of the mA and/or kV according to patient size and/or use of iterative reconstruction technique. CONTRAST:  OMNIPAQUE IOHEXOL 300 MG/ML  SOLN COMPARISON:  CT abdomen and pelvis 07/06/2018 FINDINGS: Lower chest: No acute abnormality. Hepatobiliary: Hepatic steatosis. Normal gallbladder. No biliary dilation. Pancreas: Unremarkable. Spleen: Unremarkable. Adrenals/Urinary Tract: Normal adrenal glands. No urinary calculi or hydronephrosis. Bladder is unremarkable. Stomach/Bowel: Normal caliber large and small bowel. No bowel wall thickening. The appendix is normal.Stomach is within normal limits. Vascular/Lymphatic: No significant vascular findings are present. No enlarged abdominal or pelvic lymph nodes. Reproductive: Unremarkable. Other: Small volume free fluid in the pelvis, favored physiologic. No free intraperitoneal air. Musculoskeletal: No acute fracture. IMPRESSION: No acute abnormality in the abdomen or pelvis. Hepatic steatosis. Electronically Signed   By: Minerva Fester M.D.   On: 09/11/2022 23:00   DG Chest Portable 1 View  Result Date: 09/11/2022 CLINICAL DATA:  Syncope EXAM: PORTABLE CHEST 1 VIEW COMPARISON:  04/16/2021  FINDINGS: The heart size and mediastinal contours are within normal limits. Both lungs are clear. The visualized skeletal structures are unremarkable. IMPRESSION: Normal  study. Electronically Signed   By: Charlett Nose M.D.   On: 09/11/2022 22:19    Pending Labs Unresulted Labs (From admission, onward)     Start     Ordered   09/12/22 0117  TSH  Add-on,   AD        09/12/22 0116            Vitals/Pain Today's Vitals   09/12/22 0005 09/12/22 0100 09/12/22 0102 09/12/22 0109  BP:  118/74  118/74  Pulse:    (!) 38  Resp:  20  19  Temp:      TempSrc:      SpO2:  100%  100%  PainSc: 0-No pain  0-No pain     Isolation Precautions No active isolations  Medications Medications  atropine 1 MG/10ML injection 0.4 mg (has no administration in time range)  sodium chloride 0.9 % bolus 1,000 mL (0 mLs Intravenous Stopped 09/11/22 2307)  potassium chloride SA (KLOR-CON M) CR tablet 20 mEq (20 mEq Oral Given 09/11/22 2217)  iohexol (OMNIPAQUE) 300 MG/ML solution 100 mL (100 mLs Intravenous Contrast Given 09/11/22 2235)  ondansetron (ZOFRAN) injection 4 mg (4 mg Intravenous Given 09/12/22 0033)  lactated ringers bolus 1,000 mL (1,000 mLs Intravenous New Bag/Given 09/12/22 0031)  lactated ringers bolus 1,000 mL (1,000 mLs Intravenous New Bag/Given 09/12/22 0031)    Mobility walks     Focused Assessments Cardiac Assessment Handoff:    No results found for: "CKTOTAL", "CKMB", "CKMBINDEX", "TROPONINI" No results found for: "DDIMER" Does the Patient currently have chest pain? No    R Recommendations: See Admitting Provider Note  Report given to:   Additional Notes:  Pt is alert and oriented x 4, no episodes of syncope while she has been here but her heart rate has been as low as 31.  No history of cardiac problems.

## 2022-09-12 NOTE — ED Provider Notes (Signed)
Care assumed from Dr. Maple Hudson.  22 year old female here with abdominal pain, nausea and vomiting with recurrent syncopal episodes.  Orthostatic with blood pressure in the 70s with standing.  Bradycardic in the 40s appears to be sinus.  Workup reassuring with negative CT abdomen pelvis.  She is sleeping comfortably but heart rate is 35.  This appears to be sinus without high degree AV block.  Remains orthostatic and bradycardic.  Would benefit from observation admission given recurrent syncope and persistent bradycardia.  Admission discussed with Dr. Margo Aye.  She request cardiology consultation.  Discussed with Dr. Daphine Deutscher who will have patient evaluated once patient arrives at [facility].  Remains bradycardic but asymptomatic currently.  Blood pressure 118/74   Glynn Octave, MD 09/12/22 0140

## 2022-09-12 NOTE — Plan of Care (Signed)

## 2022-09-12 NOTE — Progress Notes (Signed)
Patient admitted earlier this morning for several episodes of syncope along with symptomatic bradycardia.  Patient seen and examined at bedside and plan of care discussed with her.  I have reviewed patient's medical records including this morning's H&P, current vitals, labs and medications myself.  Follow cardiology recommendations and 2D echo.

## 2022-09-12 NOTE — Progress Notes (Addendum)
   09/12/22 0900  Assess: MEWS Score  BP 129/88  MAP (mmHg) 101  Pulse Rate (!) 35  ECG Heart Rate (!) 30  Resp 12  Level of Consciousness Alert  SpO2 100 %  O2 Device Room Air  Assess: MEWS Score  MEWS Temp 0  MEWS Systolic 0  MEWS Pulse 2  MEWS RR 1  MEWS LOC 0  MEWS Score 3  MEWS Score Color Yellow  Assess: if the MEWS score is Yellow or Red  Were vital signs accurate and taken at a resting state? Yes  Does the patient meet 2 or more of the SIRS criteria? No  MEWS guidelines implemented  Yes, yellow  Treat  MEWS Interventions Considered administering scheduled or prn medications/treatments as ordered  Take Vital Signs  Increase Vital Sign Frequency  Yellow: Q2hr x1, continue Q4hrs until patient remains green for 12hrs  Escalate  MEWS: Escalate Yellow: Discuss with charge nurse and consider notifying provider and/or RRT  Notify: Charge Nurse/RN  Name of Charge Nurse/RN Notified Diplomatic Services operational officer  Provider Notification  Provider Name/Title Glade Lloyd MD  Date Provider Notified 09/12/22  Time Provider Notified 0745  Method of Notification Page  Notification Reason Other (Comment) (MD aware)  Provider response No new orders;Other (Comment) (Cardiology consulted)  Date of Provider Response 09/12/22  Time of Provider Response 0745  Assess: SIRS CRITERIA  SIRS Temperature  0  SIRS Pulse 0  SIRS Respirations  0  SIRS WBC 0  SIRS Score Sum  0   Patient having frequent 2-3 second pauses. HR between 35-45bpm, with as low as 21bpm. Pt remains Asymptomatic. Attending and Cardiology aware.

## 2022-09-12 NOTE — Consult Note (Signed)
Referring Provider: Dr. Glade Lloyd Primary Care Physician:  Pcp, No Primary Gastroenterologist: Gentry Fitz  Reason for Consultation: Nausea, vomiting and abdominal pain  HPI: Brittany Hoffman is a 22 y.o. female with a past medical history of obesity and chronic tobacco use.  No past surgical history.  She developed lower abdominal pain x 2 weeks with N/V and diarrhea for a few days with decreased oral intake and several syncopal episodes therefore she presented to the ED 09/08/2022 for further evaluation. Labs in the ED showed a WBC count of 7.5. Hemoglobin 16.3. Glucose 163.  Creatinine 1.35. Normal LFTs. Lipase 23. Beta hCG negative. Troponin 3. EKG showed a normal sinus rhythm without acute ischemia. She received IV fluids and Zofran and her clinical status stabilized and she was discharged home.  She returned to the ED 09/11/2022 with persistent nausea, vomiting, diarrhea, lower abdominal pain and syncope.  She endorsed losing consciousness at least 3 times after standing up as witnessed by her boyfriend.  Labs showed a WBC count of 8.4.  Hemoglobin 15.3.  Potassium 3.4.  Potassium 3.4.  Magnesium 1.7.  Glucose 115.  BUN 6.  Creatinine 1.03.  Normal LFTs.  Lipase 27.  EKG showed sinus bradycardia, heart rate in the 40s.  SARS coronavirus 2 negative. Beta hCG less < 1.  HIV nonreactive.  Negative chest x-ray.  CTAP without intraabdominal/pelvic pathology to explain her symptoms. Hepatic steatosis was identified.   Telemetry showed occasional pauses with second-degree AV block type I and she was seen by cardiology who thought her syncope was likely due to combination of orthostatic hypotension and bradycardia.  Echo was ordered.  Lyme titers were ordered, results pending.  A GI consult was requested for further evaluation regarding N/V and abdominal pain.   She developed cramp-like pain below the umbilicus 3 weeks ago which worsened each time she ate any food.  No specific food triggers.  She developed  nausea with a few episodes of vomiting 2 weeks later.  Her central abdominal pain was initially constant for the first 2 weeks now comes and goes.  She also endorsed passing 1-2 round watery to mud like stools daily for the past 3 weeks.  Last diarrhea bowel movement was yesterday.  No recent antibiotic use.  No chest pains, palpitations or shortness of breath.  She has intermittent dizziness.  She denies any weight loss despite decreased oral intake for the past few weeks.  No alcohol use.  No marijuana or other drug use.  She smokes 3 small cigars daily for the past 6 years.  Father with history of stomach issues, further details unknown.  She is currently menstruating.  GI PROCEDURES:  Past Medical History:  Diagnosis Date   Known health problems: none     Past Surgical History:  Procedure Laterality Date   NO PAST SURGERIES      Prior to Admission medications   Medication Sig Start Date End Date Taking? Authorizing Provider  ondansetron (ZOFRAN) 4 MG tablet Take 1 tablet (4 mg total) by mouth every 6 (six) hours. 09/08/22  Yes Elayne Snare K, DO  loperamide (IMODIUM) 2 MG capsule Take 1 capsule (2 mg total) by mouth 4 (four) times daily as needed for diarrhea or loose stools. Patient not taking: Reported on 09/11/2022 09/08/22   Rexford Maus, DO    Current Facility-Administered Medications  Medication Dose Route Frequency Provider Last Rate Last Admin   acetaminophen (TYLENOL) tablet 650 mg  650 mg Oral Q6H PRN Dow Adolph  N, DO       atropine 1 MG/10ML injection 0.4 mg  0.4 mg Intravenous PRN Dow Adolph N, DO   0.4 mg at 09/12/22 0305   enoxaparin (LOVENOX) injection 40 mg  40 mg Subcutaneous Q24H Hall, Carole N, DO       lactated ringers infusion   Intravenous Continuous Darlin Drop, DO 50 mL/hr at 09/12/22 1235 Infusion Verify at 09/12/22 1235   melatonin tablet 5 mg  5 mg Oral QHS PRN Darlin Drop, DO       nicotine (NICODERM CQ - dosed in mg/24 hours) patch 14 mg   14 mg Transdermal Daily PRN Darlin Drop, DO       Oral care mouth rinse  15 mL Mouth Rinse PRN Margo Aye, Carole N, DO       polyethylene glycol (MIRALAX / GLYCOLAX) packet 17 g  17 g Oral Daily PRN Dow Adolph N, DO       prochlorperazine (COMPAZINE) injection 5 mg  5 mg Intravenous Q6H PRN Dow Adolph N, DO        Allergies as of 09/11/2022   (No Known Allergies)    Family History  Problem Relation Age of Onset   Hypertension Mother     Social History   Socioeconomic History   Marital status: Single    Spouse name: Not on file   Number of children: Not on file   Years of education: Not on file   Highest education level: Not on file  Occupational History   Not on file  Tobacco Use   Smoking status: Some Days    Types: Cigars   Smokeless tobacco: Never  Vaping Use   Vaping status: Never Used  Substance and Sexual Activity   Alcohol use: Yes   Drug use: Yes   Sexual activity: Yes  Other Topics Concern   Not on file  Social History Narrative   ** Merged History Encounter **       Social Determinants of Health   Financial Resource Strain: Not on file  Food Insecurity: No Food Insecurity (09/12/2022)   Hunger Vital Sign    Worried About Running Out of Food in the Last Year: Never true    Ran Out of Food in the Last Year: Never true  Transportation Needs: Unmet Transportation Needs (09/12/2022)   PRAPARE - Administrator, Civil Service (Medical): Yes    Lack of Transportation (Non-Medical): Yes  Physical Activity: Not on file  Stress: Not on file  Social Connections: Not on file  Intimate Partner Violence: Not At Risk (09/12/2022)   Humiliation, Afraid, Rape, and Kick questionnaire    Fear of Current or Ex-Partner: No    Emotionally Abused: No    Physically Abused: No    Sexually Abused: No    Review of Systems: Gen: Denies fever, sweats or chills. No weight loss.  CV: Denies chest pain, palpitations or edema. Resp: Denies cough, shortness of breath  of hemoptysis.  GI: See HPI. GU : Denies urinary burning, blood in urine, increased urinary frequency or incontinence. MS: Denies joint pain, muscles aches or weakness. Derm: Denies rash, itchiness, skin lesions or unhealing ulcers. Psych: Denies depression, anxiety, memory loss or confusion. Heme: Denies easy bruising, bleeding. Neuro:  Denies headaches, dizziness or paresthesias. Endo:  Denies any problems with DM, thyroid or adrenal function.  Physical Exam: Vital signs in last 24 hours: Temp:  [97.6 F (36.4 C)-98.1 F (36.7 C)] 97.6 F (36.4  C) (09/06 0446) Pulse Rate:  [35-60] 38 (09/06 1200) Resp:  [11-20] 11 (09/06 1200) BP: (89-143)/(61-97) 127/80 (09/06 1200) SpO2:  [100 %] 100 % (09/06 1200) Weight:  [105.2 kg] 105.2 kg (09/06 0339)   General: 22 year old female in no acute distress. Head:  Normocephalic and atraumatic. Eyes:  No scleral icterus. Conjunctiva pink. Ears:  Normal auditory acuity. Nose:  No deformity, discharge or lesions. Mouth:  Dentition intact. No ulcers or lesions.  Neck:  Supple. No lymphadenopathy or thyromegaly.  Lungs: Breath sounds clear throughout. No wheezes, rhonchi or crackles.  Heart: Bradycardic, no murmurs. Abdomen: Soft, nondistended.  Mild tenderness below the umbilicus without rebound or guarding.  Positive bowel sounds to all 4 quadrants. Rectal: Deferred. GYN: Currently menstruating with Pure[employer] collecting menstrual blood. Musculoskeletal:  Symmetrical without gross deformities.  Pulses:  Normal pulses noted. Extremities:  Without clubbing or edema. Neurologic:  Alert and  oriented x 4. No focal deficits.  Skin:  Intact without significant lesions or rashes. Psych:  Alert and cooperative. Normal mood and affect.  Intake/Output from previous day: 09/05 0701 - 09/06 0700 In: 2050.4 [I.V.:50.4; IV Piggyback:2000] Out: 400 [Urine:400] Intake/Output this shift: Total I/O In: 357.4 [I.V.:357.4] Out: -   Lab Results: Recent  Labs    09/11/22 2056 09/12/22 0301 09/12/22 0353  WBC 8.4 8.9 8.8  HGB 15.3* 13.7 15.4*  HCT 47.0* 42.7 46.0  PLT 283 246 214   BMET Recent Labs    09/11/22 2056 09/12/22 0301 09/12/22 0353  NA 138 139 138  K 3.4* 4.1 3.9  CL 104 106 105  CO2 24 25 24   GLUCOSE 115* 140* 101*  BUN 6 6 <5*  CREATININE 1.03* 1.02* 0.92  CALCIUM 8.3* 8.4* 8.4*   LFT Recent Labs    09/11/22 2056  PROT 5.6*  ALBUMIN 3.2*  AST 11*  ALT 14  ALKPHOS 33*  BILITOT 0.5   PT/INR No results for input(s): "LABPROT", "INR" in the last 72 hours. Hepatitis Panel No results for input(s): "HEPBSAG", "HCVAB", "HEPAIGM", "HEPBIGM" in the last 72 hours.    Studies/Results: CT ABDOMEN PELVIS W CONTRAST  Result Date: 09/11/2022 CLINICAL DATA:  Abdominal pain, umbilical pain, nausea, vomiting EXAM: CT ABDOMEN AND PELVIS WITH CONTRAST TECHNIQUE: Multidetector CT imaging of the abdomen and pelvis was performed using the standard protocol following bolus administration of intravenous contrast. RADIATION DOSE REDUCTION: This exam was performed according to the departmental dose-optimization program which includes automated exposure control, adjustment of the mA and/or kV according to patient size and/or use of iterative reconstruction technique. CONTRAST:  OMNIPAQUE IOHEXOL 300 MG/ML  SOLN COMPARISON:  CT abdomen and pelvis 07/06/2018 FINDINGS: Lower chest: No acute abnormality. Hepatobiliary: Hepatic steatosis. Normal gallbladder. No biliary dilation. Pancreas: Unremarkable. Spleen: Unremarkable. Adrenals/Urinary Tract: Normal adrenal glands. No urinary calculi or hydronephrosis. Bladder is unremarkable. Stomach/Bowel: Normal caliber large and small bowel. No bowel wall thickening. The appendix is normal.Stomach is within normal limits. Vascular/Lymphatic: No significant vascular findings are present. No enlarged abdominal or pelvic lymph nodes. Reproductive: Unremarkable. Other: Small volume free fluid in the  pelvis, favored physiologic. No free intraperitoneal air. Musculoskeletal: No acute fracture. IMPRESSION: No acute abnormality in the abdomen or pelvis. Hepatic steatosis. Electronically Signed   By: Minerva Fester M.D.   On: 09/11/2022 23:00   DG Chest Portable 1 View  Result Date: 09/11/2022 CLINICAL DATA:  Syncope EXAM: PORTABLE CHEST 1 VIEW COMPARISON:  04/16/2021 FINDINGS: The heart size and mediastinal contours are within normal limits. Both  lungs are clear. The visualized skeletal structures are unremarkable. IMPRESSION: Normal study. Electronically Signed   By: Charlett Nose M.D.   On: 09/11/2022 22:19    IMPRESSION/PLAN:  22 year old female subumbilical abdominal pain and nonbloody diarrhea x 3 weeks with intermittent N/V x 1 week with associated syncope. CTAP without intra-abdominal/pelvic pathology to explain her symptoms.  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 -GI pathogen panel -CRP, sed rate in a.m. -Famotidine 20 mg p.o. daily -Continue Compazine 5 mg IV every 6 hours if okay with cardiology -Heart healthy diet as tolerated -Defer endoscopic recommendations to Dr. Adela Lank  Recurrent syncope, type factorial: Decreased p.o. intake, orthostatic hypotension and bradycardia  Bradycardia, etiology unclear. Telemetry showed occasional pauses with second-degree AV block type I . -Seen by cardiology, ordered Lyme titer and echo  Hepatic steatosis per CTAP.  Normal LFTs. -Recommend outpatient follow-up   Arnaldo Natal  09/12/2022, 1:42PM

## 2022-09-12 NOTE — Consult Note (Addendum)
Cardiology Consultation   Patient ID: Brittany Hoffman MRN: [id number]; DOB: 09-Mar-2000  Admit date: 09/11/2022 Date of Consult: 09/12/2022  PCP:  Oneita Hurt, No   [facility] Providers Cardiologist:  New to [facility]     Patient Profile:   Brittany Hoffman is a 22 y.o. female with no PMH who is being seen 09/12/2022 for the evaluation of recurrent syncope at the request of Dr. Hanley Ben.  History of Present Illness:   [name] is a 22 year old female with no past medical history and does not take any medications.  She has no prior pregnancy history.  She has never underwent any previous surgery.  Mother has a history of blood clot.  Father is not involved in her life. She occasionally smokes cigars.  She does not drink large quantity of alcohol or do any illicit drug.  In the past several weeks, she has been having central and epigastric abdominal discomfort.  She also has decreased appetite.  Symptom worsened for the past week.  She says whenever she does have some appetite and tried to eat food, she would have worsening abdominal pain a hour after food ingestion.  She also had increased dizziness.  On Monday, when she got up to walk to the bathroom, she had a syncopal episode.  This prompted the patient to come to the emergency room.  On arrival, she was hypotensive to the 80s over 50s.  Her blood pressure is improved without intervention.  She was given Tylenol, Zofran and 1 L of lactated ringer.  Lipase was negative.  Troponin negative x 2.  Urinalysis negative.  She was ultimately discharged on Zofran and Imodium.  Since discharge, she continued to intermittent dizzy spell.  Yesterday, she passed out 3 different times, this prompted the patient to come back to the hospital.  EKG obtained in the emergency room showed severe sinus bradycardia with heart rate of 35 bpm.  Her orthostatic vital sign was also positive.  Systolic pressure change from 621 down to 78 upon standing.  On telemetry, patient  also had occasional pauses with second-degree AV block type I.  Cardiology service was consulted for recurrent syncope and bradycardia.   Past Medical History:  Diagnosis Date   Known health problems: none     Past Surgical History:  Procedure Laterality Date   NO PAST SURGERIES       Home Medications:  Prior to Admission medications   Medication Sig Start Date End Date Taking? Authorizing Provider  ondansetron (ZOFRAN) 4 MG tablet Take 1 tablet (4 mg total) by mouth every 6 (six) hours. 09/08/22  Yes Elayne Snare K, DO  loperamide (IMODIUM) 2 MG capsule Take 1 capsule (2 mg total) by mouth 4 (four) times daily as needed for diarrhea or loose stools. Patient not taking: Reported on 09/11/2022 09/08/22   Rexford Maus, DO    Inpatient Medications: Scheduled Meds:  enoxaparin (LOVENOX) injection  40 mg Subcutaneous Q24H   Continuous Infusions:  lactated ringers 50 mL/hr at 09/12/22 0500   PRN Meds: acetaminophen, atropine, melatonin, nicotine, mouth rinse, polyethylene glycol, prochlorperazine  Allergies:   No Known Allergies  Social History:   Social History   Socioeconomic History   Marital status: Single    Spouse name: Not on file   Number of children: Not on file   Years of education: Not on file   Highest education level: Not on file  Occupational History   Not on file  Tobacco Use   Smoking  status: Some Days    Types: Cigars   Smokeless tobacco: Never  Vaping Use   Vaping status: Never Used  Substance and Sexual Activity   Alcohol use: Yes   Drug use: Yes   Sexual activity: Yes  Other Topics Concern   Not on file  Social History Narrative   ** Merged History Encounter **       Social Determinants of Health   Financial Resource Strain: Not on file  Food Insecurity: No Food Insecurity (09/12/2022)   Hunger Vital Sign    Worried About Running Out of Food in the Last Year: Never true    Ran Out of Food in the Last Year: Never true   Transportation Needs: Unmet Transportation Needs (09/12/2022)   PRAPARE - Administrator, Civil Service (Medical): Yes    Lack of Transportation (Non-Medical): Yes  Physical Activity: Not on file  Stress: Not on file  Social Connections: Not on file  Intimate Partner Violence: Not At Risk (09/12/2022)   Humiliation, Afraid, Rape, and Kick questionnaire    Fear of Current or Ex-Partner: No    Emotionally Abused: No    Physically Abused: No    Sexually Abused: No    Family History:    Family History  Problem Relation Age of Onset   Hypertension Mother      ROS:  Please see the history of present illness.   All other ROS reviewed and negative.     Physical Exam/Data:   Vitals:   09/12/22 0500 09/12/22 0800 09/12/22 0900 09/12/22 1000  BP: 109/83 102/62 129/88 (!) 143/97  Pulse: (!) 39 (!) 40 (!) 35 (!) 39  Resp: 12 17 12 14   Temp:      TempSrc:      SpO2: 100% 100% 100% 100%  Weight:      Height:        Intake/Output Summary (Last 24 hours) at 09/12/2022 1120 Last data filed at 09/12/2022 0500 Gross per 24 hour  Intake 2050.39 ml  Output 400 ml  Net 1650.39 ml      09/12/2022    3:39 AM 09/08/2022    7:14 PM 04/16/2021   11:09 PM  Last 3 Weights  Weight (lbs) 231 lb 14.8 oz 230 lb 171 lb 15.3 oz  Weight (kg) 105.2 kg 104.327 kg 78 kg     Body mass index is 32.35 kg/m.  General:  Well nourished, well developed, in no acute distress HEENT: normal Neck: no JVD Vascular: No carotid bruits; Distal pulses 2+ bilaterally Cardiac:  normal S1, S2; RRR; no murmur  Lungs:  clear to auscultation bilaterally, no wheezing, rhonchi or rales  Abd: soft, nontender, no hepatomegaly  Ext: no edema Musculoskeletal:  No deformities, BUE and BLE strength normal and equal Skin: warm and dry  Neuro:  CNs 2-12 intact, no focal abnormalities noted Psych:  Normal affect   EKG:  The EKG was personally reviewed and demonstrates: Sinus bradycardia, heart rate 35 bpm. Telemetry:   Telemetry was personally reviewed and demonstrates: Sinus bradycardia, heart rate 30 to 40s.  Relevant CV Studies:  N/A  Laboratory Data:  High Sensitivity Troponin:   Recent Labs  Lab 09/08/22 1918 09/08/22 2130  TROPONINIHS 3 <2     Chemistry Recent Labs  Lab 09/08/22 1918 09/11/22 2056 09/12/22 0301 09/12/22 0353  NA 140 138 139 138  K 3.9 3.4* 4.1 3.9  CL 106 104 106 105  CO2 22 24 25 24   GLUCOSE  163* 115* 140* 101*  BUN 7 6 6  <5*  CREATININE 1.35* 1.03* 1.02* 0.92  CALCIUM 8.5* 8.3* 8.4* 8.4*  MG 2.0  --  1.7 1.7  GFRNONAA 56* >60 >60 >60  ANIONGAP 12 10 8 9     Recent Labs  Lab 09/08/22 1918 09/11/22 2056  PROT 5.9* 5.6*  ALBUMIN 3.1* 3.2*  AST 14* 11*  ALT 13 14  ALKPHOS 38 33*  BILITOT 0.8 0.5   Lipids No results for input(s): "CHOL", "TRIG", "HDL", "LABVLDL", "LDLCALC", "CHOLHDL" in the last 168 hours.  Hematology Recent Labs  Lab 09/11/22 2056 09/12/22 0301 09/12/22 0353  WBC 8.4 8.9 8.8  RBC 5.15* 4.60 5.04  HGB 15.3* 13.7 15.4*  HCT 47.0* 42.7 46.0  MCV 91.3 92.8 91.3  MCH 29.7 29.8 30.6  MCHC 32.6 32.1 33.5  RDW 13.2 13.2 13.2  PLT 283 246 214   Thyroid  Recent Labs  Lab 09/12/22 0353  TSH 1.566    BNPNo results for input(s): "BNP", "PROBNP" in the last 168 hours.  DDimer No results for input(s): "DDIMER" in the last 168 hours.   Radiology/Studies:  CT ABDOMEN PELVIS W CONTRAST  Result Date: 09/11/2022 CLINICAL DATA:  Abdominal pain, umbilical pain, nausea, vomiting EXAM: CT ABDOMEN AND PELVIS WITH CONTRAST TECHNIQUE: Multidetector CT imaging of the abdomen and pelvis was performed using the standard protocol following bolus administration of intravenous contrast. RADIATION DOSE REDUCTION: This exam was performed according to the departmental dose-optimization program which includes automated exposure control, adjustment of the mA and/or kV according to patient size and/or use of iterative reconstruction technique. CONTRAST:   OMNIPAQUE IOHEXOL 300 MG/ML  SOLN COMPARISON:  CT abdomen and pelvis 07/06/2018 FINDINGS: Lower chest: No acute abnormality. Hepatobiliary: Hepatic steatosis. Normal gallbladder. No biliary dilation. Pancreas: Unremarkable. Spleen: Unremarkable. Adrenals/Urinary Tract: Normal adrenal glands. No urinary calculi or hydronephrosis. Bladder is unremarkable. Stomach/Bowel: Normal caliber large and small bowel. No bowel wall thickening. The appendix is normal.Stomach is within normal limits. Vascular/Lymphatic: No significant vascular findings are present. No enlarged abdominal or pelvic lymph nodes. Reproductive: Unremarkable. Other: Small volume free fluid in the pelvis, favored physiologic. No free intraperitoneal air. Musculoskeletal: No acute fracture. IMPRESSION: No acute abnormality in the abdomen or pelvis. Hepatic steatosis. Electronically Signed   By: Minerva Fester M.D.   On: 09/11/2022 23:00   DG Chest Portable 1 View  Result Date: 09/11/2022 CLINICAL DATA:  Syncope EXAM: PORTABLE CHEST 1 VIEW COMPARISON:  04/16/2021 FINDINGS: The heart size and mediastinal contours are within normal limits. Both lungs are clear. The visualized skeletal structures are unremarkable. IMPRESSION: Normal study. Electronically Signed   By: Charlett Nose M.D.   On: 09/11/2022 22:19     Assessment and Plan:   Recurrent syncope: Likely due to combination of bradycardia and orthostatic hypotension.  Obtain echocardiogram  Bradycardia: Viral panel including influenza, RSV and COVID were negative.  No prior history of COVID-vaccine. TSH normal. Heart rate 30s in the ED with evidence of second-degree type I AV block.  Although she did have 1 episode of vomiting yesterday, however her bradycardia precede the vomiting episode and was actually present on EKG on Monday. Will discuss with MD, consider to rule out Lyme disease.  Patient does not drink alcohol.  Suspicion for ACS is low.  Dr. Anne Fu curbsided Dr. Elberta Fortis of EP  service, who felt this is likely dysautonomia vagal stimulation from abdominal issue.   Orthostatic hypotension: Patient has lost appetite and has not been eating for  the past week.  Likely as a result of poor oral intake.  Agree with IV hydration.    Abdominal discomfort: CT abdomen and pelvis obtained on 09/11/2022 showed no acute abnormality in the abdomen or pelvis.  Lipase negative.  Symptom has been going on for several weeks.  She initially thought it was her period, however symptoms never improved.  She says she has been afraid to eat as her abdominal pain will get worse about an hour after she eats.  No prior surgery.  Consider GI evaluation. If need endoscopy, have atropine as needed on hand.    Risk Assessment/Risk Scores:                For questions or updates, please contact [facility] Please consult [email or web address] for contact info under    Signed, Azalee Course, PA  09/12/2022 11:20 AM  Personally seen and examined. Agree with above.  22 year old female with several days of chronic abdominal discomfort with nausea, vomiting when trying to eat, or even think about certain foods or liquids who we are seeing because of significant bradycardia with heart rates in the 30s, Mobitz type I, occasional pauses of approximately 3 seconds, recent syncopal episodes 3 times, orthostatic hypotension.  No recent mentioning of tick bite.  No drug use no alcohol use.  Troponins negative lipase negative CT abdomen pelvis negative no evidence of vascular abnormality to intestinal region.  Orthostatics were abnormal 105 down to 78 upon standing.  Upon questioning her, looking at telemetry heart rates were in the 30s.  Mentating well laying down.  Exam unremarkable except for bradycardia.  Electrolytes normal.  BUN however is less than 5.  Albumin 3.1.  TSH 1.5 normal.  Echo pending.  It is possible that her syncopal episodes are more relatable to dysautonomia in the setting of abdominal  discomfort increasing overall vagal tone with concomitant dehydration and malnutrition exacerbating her symptomatology.  Recommendations are to aggressively rehydrate with IV fluids which is currently taking place.  Salt liberalization.  Try to understand the underlying etiology for her abdominal discomfort.  I think once the abdominal discomfort resolves, the symptoms will resolve as well of bradycardia and syncope.  I do not think that the bradycardia is causing the syncope.  Once again I think this is a response to a hyper vagal tone elicited by her intra-abdominal discomfort.  If endoscopy is necessary, she may proceed from a cardiology perspective.  Of course bradycardia will be noted and atropine can be utilized if necessary.  There is no indication for permanent pacemaker.  I was able to discuss with one of my EP colleagues.  Donato Schultz, MD

## 2022-09-12 NOTE — Care Management (Signed)
Transition of Care [facility]) - Inpatient Brief Assessment   Patient Details  Name: Brittany Hoffman MRN: [id number] Date of Birth: January 26, 2000  Transition of Care [facility]) CM/SW Contact:    Lockie Pares, RN Phone Number: 09/12/2022, 12:18 PM   Clinical Narrative: Patient presented with no medical history, with syncope. Noted to be bradycardic. Is having stomach pain after eating . Abdominal CT negative.  Having intermittent nausea. Patient has no PCP and is uninsured. She is listed at working at [employer]. Information on AVS for PCP. May need MATCH for medications.  TOC will follow for needs, recommendations, and transitions of care.   Transition of Care Asessment: Insurance and Status: Selfpay Patient has primary care physician: No Home environment has been reviewed: yes Prior level of function:: Independent Prior/Current Home Services: No current home services Social Determinants of Health Reivew: SDOH reviewed needs interventions Readmission risk has been reviewed: Yes Transition of care needs: transition of care needs identified, TOC will continue to follow

## 2022-09-12 NOTE — H&P (Signed)
History and Physical  Brittany Hoffman BJY:[id number] DOB: 28-Jul-2000 DOA: 09/11/2022  Referring physician: Dr. Manus Gunning, EDP  PCP: Pcp, No  Outpatient Specialists: None Patient coming from: Home  Chief Complaint: Passing out several times.  HPI: Brittany Hoffman is a 22 y.o. female with medical history significant for obesity, tobacco use disorder, presents to the ED after passing out several times today.  Associated with decreased p.o. intake, nausea, vomiting, and intermittent abdominal pain for the past few weeks.  She states she has lost consciousness at least 3 times after standing up today.  This was witnessed by her boyfriend.  Her mother who is 62 years old follows with cardiology.  In the ED, the patient was noted to be bradycardic with heart rate in the 30s, sustained.  No evidence of heart block on twelve-lead EKG.  Denies chest pain or shortness of breath.  Due to concern for symptomatic bradycardia, cardiology was consulted.  TRH, hospitalist service, was asked to admit.  The patient was admitted to [facility] telemetry cardiac unit as inpatient status.  ED Course: Tmax 98.1.  BP 125/71, pulse 41, respiratory 18, saturation 100% on room air.  Lab studies notable for serum bicarb 3.4, lipase 27.  WBC 8.4.  Hemoglobin 13.3.  Platelet count 283.  Review of Systems: Review of systems as noted in the HPI. All other systems reviewed and are negative.   Past Medical History:  Diagnosis Date   Known health problems: none    Past Surgical History:  Procedure Laterality Date   NO PAST SURGERIES      Social History:  reports that she has been smoking cigars. She has never used smokeless tobacco. She reports current alcohol use. She reports current drug use.   No Known Allergies  Family History  Problem Relation Age of Onset   Hypertension Mother       Prior to Admission medications   Medication Sig Start Date End Date Taking? Authorizing Provider  ondansetron (ZOFRAN) 4 MG  tablet Take 1 tablet (4 mg total) by mouth every 6 (six) hours. 09/08/22  Yes Elayne Snare K, DO  loperamide (IMODIUM) 2 MG capsule Take 1 capsule (2 mg total) by mouth 4 (four) times daily as needed for diarrhea or loose stools. Patient not taking: Reported on 09/11/2022 09/08/22   Rexford Maus, DO    Physical Exam: BP 118/74 (BP Location: Left Arm)   Pulse (!) 38   Temp 97.7 F (36.5 C) (Oral)   Resp 19   SpO2 100%   General: 22 y.o. year-old female well developed well nourished in no acute distress.  Alert and oriented x3. Cardiovascular: Bradycardic with no rubs or gallops.  No thyromegaly or JVD noted.  No lower extremity edema. 2/4 pulses in all 4 extremities. Respiratory: Clear to auscultation with no wheezes or rales. Good inspiratory effort. Abdomen: Soft nontender nondistended with normal bowel sounds x4 quadrants. Muskuloskeletal: No cyanosis, clubbing or edema noted bilaterally Neuro: CN II-XII intact, strength, sensation, reflexes Skin: No ulcerative lesions noted or rashes Psychiatry: Judgement and insight appear normal. Mood is appropriate for condition and setting          Labs on Admission:  Basic Metabolic Panel: Recent Labs  Lab 09/08/22 1918 09/11/22 2056  NA 140 138  K 3.9 3.4*  CL 106 104  CO2 22 24  GLUCOSE 163* 115*  BUN 7 6  CREATININE 1.35* 1.03*  CALCIUM 8.5* 8.3*  MG 2.0  --    Liver Function  Tests: Recent Labs  Lab 09/08/22 1918 09/11/22 2056  AST 14* 11*  ALT 13 14  ALKPHOS 38 33*  BILITOT 0.8 0.5  PROT 5.9* 5.6*  ALBUMIN 3.1* 3.2*   Recent Labs  Lab 09/08/22 1918 09/11/22 2116  LIPASE 23 27   No results for input(s): "AMMONIA" in the last 168 hours. CBC: Recent Labs  Lab 09/08/22 1918 09/11/22 2056  WBC 7.5 8.4  NEUTROABS  --  5.4  HGB 16.3* 15.3*  HCT 50.6* 47.0*  MCV 91.2 91.3  PLT 302 283   Cardiac Enzymes: No results for input(s): "CKTOTAL", "CKMB", "CKMBINDEX", "TROPONINI" in the last 168 hours.  BNP  (last 3 results) No results for input(s): "BNP" in the last 8760 hours.  ProBNP (last 3 results) No results for input(s): "PROBNP" in the last 8760 hours.  CBG: No results for input(s): "GLUCAP" in the last 168 hours.  Radiological Exams on Admission: CT ABDOMEN PELVIS W CONTRAST  Result Date: 09/11/2022 CLINICAL DATA:  Abdominal pain, umbilical pain, nausea, vomiting EXAM: CT ABDOMEN AND PELVIS WITH CONTRAST TECHNIQUE: Multidetector CT imaging of the abdomen and pelvis was performed using the standard protocol following bolus administration of intravenous contrast. RADIATION DOSE REDUCTION: This exam was performed according to the departmental dose-optimization program which includes automated exposure control, adjustment of the mA and/or kV according to patient size and/or use of iterative reconstruction technique. CONTRAST:  OMNIPAQUE IOHEXOL 300 MG/ML  SOLN COMPARISON:  CT abdomen and pelvis 07/06/2018 FINDINGS: Lower chest: No acute abnormality. Hepatobiliary: Hepatic steatosis. Normal gallbladder. No biliary dilation. Pancreas: Unremarkable. Spleen: Unremarkable. Adrenals/Urinary Tract: Normal adrenal glands. No urinary calculi or hydronephrosis. Bladder is unremarkable. Stomach/Bowel: Normal caliber large and small bowel. No bowel wall thickening. The appendix is normal.Stomach is within normal limits. Vascular/Lymphatic: No significant vascular findings are present. No enlarged abdominal or pelvic lymph nodes. Reproductive: Unremarkable. Other: Small volume free fluid in the pelvis, favored physiologic. No free intraperitoneal air. Musculoskeletal: No acute fracture. IMPRESSION: No acute abnormality in the abdomen or pelvis. Hepatic steatosis. Electronically Signed   By: Minerva Fester M.D.   On: 09/11/2022 23:00   DG Chest Portable 1 View  Result Date: 09/11/2022 CLINICAL DATA:  Syncope EXAM: PORTABLE CHEST 1 VIEW COMPARISON:  04/16/2021 FINDINGS: The heart size and mediastinal contours  are within normal limits. Both lungs are clear. The visualized skeletal structures are unremarkable. IMPRESSION: Normal study. Electronically Signed   By: Charlett Nose M.D.   On: 09/11/2022 22:19    EKG: I independently viewed the EKG done and my findings are as followed: Sinus bradycardia.  Assessment/Plan Present on Admission:  Syncope  Principal Problem:   Syncope  Syncope with concern for symptomatic bradycardia Endorses several syncopal episodes since 09/08/2022. Obtain orthostatic vital signs Follow 2D echo to rule out any structural cardiac abnormalities. Monitor on telemetry Cardiology consulted Transfer from [facility] ED to [facility] telemetry cardiac unit. Optimize magnesium and potassium levels.  Sinus bradycardia, symptomatic Heart rate in the 30s to low 40s Follow TSH As needed atropine for severe bradycardia Monitor on telemetry  Tobacco use disorder Recommend complete tobacco cessation Nicotine patch  Obesity Weight 104.3 kg Recommend weight loss outpatient with regular physical activity and healthy dieting.   Time: 75 minutes.   DVT prophylaxis: Subcu Lovenox daily  Code Status: Full code  Family Communication: Updated the patient's boyfriend at bedside.  Disposition Plan: Admitted to telemetry cardiac unit  Consults called: Cardiology  Admission status: Inpatient status.   Status is: Inpatient  The patient requires at least 2 midnights for further evaluation and treatment of present condition.   Darlin Drop MD [facility] Pager [phone]  If 7PM-7AM, please contact night-coverage [email or web address] Password [email or web address]  09/12/2022, 2:49 AM

## 2022-09-13 LAB — SEDIMENTATION RATE: Sed Rate: 1 mm/h (ref 0–22)

## 2022-09-13 LAB — LYME DISEASE SEROLOGY W/REFLEX: Lyme Total Antibody EIA: NEGATIVE

## 2022-09-13 LAB — CBC
HCT: 40 % (ref 36.0–46.0)
Hemoglobin: 13.1 g/dL (ref 12.0–15.0)
MCH: 29.6 pg (ref 26.0–34.0)
MCHC: 32.8 g/dL (ref 30.0–36.0)
MCV: 90.3 fL (ref 80.0–100.0)
Platelets: 237 10*3/uL (ref 150–400)
RBC: 4.43 MIL/uL (ref 3.87–5.11)
RDW: 13.2 % (ref 11.5–15.5)
WBC: 6.4 10*3/uL (ref 4.0–10.5)
nRBC: 0 % (ref 0.0–0.2)

## 2022-09-13 LAB — C-REACTIVE PROTEIN: CRP: 0.8 mg/dL (ref <1.0)

## 2022-09-13 MED ORDER — FAMOTIDINE 20 MG PO TABS: 20.0000 mg | ORAL_TABLET | Freq: Every day | ORAL | 0 refills | Status: DC

## 2022-09-13 NOTE — TOC Transition Note (Signed)
Transition of Care [facility]) - CM/SW Discharge Note   Patient Details  Name: Brittany Hoffman MRN: [id number] Date of Birth: October 07, 2000  [facility]) CM/SW Contact:  Lawerance Sabal, RN Phone Number: 09/13/2022, 12:23 PM   Clinical Narrative:     Added resources for primary care to AVS for patient to schedule when offices reopen Monday        Patient Goals and CMS Choice      Discharge Placement                         Discharge Plan and Services Additional resources added to the After Visit Summary for                                       Social Determinants of Health (SDOH) Interventions SDOH Screenings   Food Insecurity: No Food Insecurity (09/12/2022)  Housing: High Risk (09/12/2022)  Transportation Needs: Unmet Transportation Needs (09/12/2022)  Utilities: Not At Risk (09/12/2022)  Tobacco Use: High Risk (09/11/2022)     Readmission Risk Interventions     No data to display

## 2022-09-13 NOTE — Plan of Care (Signed)

## 2022-09-13 NOTE — Progress Notes (Signed)
Cardiologist:  Anne Fu  Subjective:  Denies SSCP, palpitations or Dyspnea Still with some GI distress   Objective:  Vitals:   09/13/22 0530 09/13/22 0600 09/13/22 0630 09/13/22 0934  BP: 107/67 123/89 102/71 115/68  Pulse: (!) 50 (!) 52 (!) 42 (!) 58  Resp: 14 15 15 18   Temp:    97.6 F (36.4 C)  TempSrc:    Oral  SpO2: 99% 100% 100% 100%  Weight:      Height:        Intake/Output from previous day:  Intake/Output Summary (Last 24 hours) at 09/13/2022 1001 Last data filed at 09/13/2022 0054 Gross per 24 hour  Intake 1155.75 ml  Output 1875 ml  Net -719.25 ml    Physical Exam:  Affect appropriate Healthy:  appears stated age HEENT: normal Neck supple with no adenopathy JVP normal no bruits no thyromegaly Lungs clear with no wheezing and good diaphragmatic motion Heart:  S1/S2 no murmur, no rub, gallop or click PMI normal Abdomen: benighn, BS positve, no tenderness, no AAA no bruit.  No HSM or HJR Distal pulses intact with no bruits No edema Neuro non-focal Skin warm and dry No muscular weakness   Lab Results: Basic Metabolic Panel: Recent Labs    09/12/22 0301 09/12/22 0353  NA 139 138  K 4.1 3.9  CL 106 105  CO2 25 24  GLUCOSE 140* 101*  BUN 6 <5*  CREATININE 1.02* 0.92  CALCIUM 8.4* 8.4*  MG 1.7 1.7  PHOS 4.0 4.2   Liver Function Tests: Recent Labs    09/11/22 2056  AST 11*  ALT 14  ALKPHOS 33*  BILITOT 0.5  PROT 5.6*  ALBUMIN 3.2*   Recent Labs    09/11/22 2116  LIPASE 27   CBC: Recent Labs    09/11/22 2056 09/12/22 0301 09/12/22 0353 09/13/22 0414  WBC 8.4   < > 8.8 6.4  NEUTROABS 5.4  --   --   --   HGB 15.3*   < > 15.4* 13.1  HCT 47.0*   < > 46.0 40.0  MCV 91.3   < > 91.3 90.3  PLT 283   < > 214 237   < > = values in this interval not displayed.    Thyroid Function Tests: Recent Labs    09/12/22 0353  TSH 1.566     Imaging: ECHOCARDIOGRAM COMPLETE  Result Date: 09/12/2022    ECHOCARDIOGRAM REPORT   Patient  Name:   Brittany Hoffman Date of Exam: 09/12/2022 Medical Rec #:  [id number]  Height:       71.0 in Accession #:    [id number] Weight:       231.9 lb Date of Birth:  2000/04/09  BSA:          2.246 m Patient Age:    22 years   BP:           109/83 mmHg Patient Gender: F          HR:           40 bpm. Exam Location:  Inpatient Procedure: 2D Echo, Color Doppler and Cardiac Doppler Indications:    Syncope  History:        Patient has no prior history of Echocardiogram examinations.                 Signs/Symptoms:Syncope.  Sonographer:    Milbert Coulter Referring Phys: [id number] CAROLE N HALL IMPRESSIONS  1. Left ventricular ejection fraction, by estimation, is  60 to 65%. Left ventricular ejection fraction by PLAX is 61 %. The left ventricle has normal function. The left ventricle has no regional wall motion abnormalities. Left ventricular diastolic parameters are indeterminate.  2. Right ventricular systolic function is normal. The right ventricular size is normal.  3. The mitral valve is abnormal. Trivial mitral valve regurgitation.  4. The aortic valve is tricuspid. Aortic valve regurgitation is not visualized.  5. The inferior vena cava is normal in size with greater than 50% respiratory variability, suggesting right atrial pressure of 3 mmHg.  6. Rhythm strip during this exam demonstrates marked sinus bradycardia. Comparison(s): No prior Echocardiogram. FINDINGS  Left Ventricle: Left ventricular ejection fraction, by estimation, is 60 to 65%. Left ventricular ejection fraction by PLAX is 61 %. The left ventricle has normal function. The left ventricle has no regional wall motion abnormalities. The left ventricular internal cavity size was normal in size. There is no left ventricular hypertrophy. Left ventricular diastolic parameters are indeterminate. Right Ventricle: The right ventricular size is normal. No increase in right ventricular wall thickness. Right ventricular systolic function is normal. Left Atrium: Left atrial  size was normal in size. Right Atrium: Right atrial size was normal in size. Pericardium: There is no evidence of pericardial effusion. Mitral Valve: The mitral valve is abnormal. There is mild thickening of the anterior and posterior mitral valve leaflet(s). Trivial mitral valve regurgitation. Tricuspid Valve: The tricuspid valve is not well visualized. Tricuspid valve regurgitation is not demonstrated. Aortic Valve: The aortic valve is tricuspid. Aortic valve regurgitation is not visualized. Aortic valve mean gradient measures 4.0 mmHg. Aortic valve peak gradient measures 8.6 mmHg. Aortic valve area, by VTI measures 2.43 cm. Pulmonic Valve: The pulmonic valve was normal in structure. Pulmonic valve regurgitation is not visualized. Aorta: The aortic root and ascending aorta are structurally normal, with no evidence of dilitation. Venous: The inferior vena cava is normal in size with greater than 50% respiratory variability, suggesting right atrial pressure of 3 mmHg. IAS/Shunts: No atrial level shunt detected by color flow Doppler. EKG: Rhythm strip during this exam demonstrates marked sinus bradycardia.  LEFT VENTRICLE PLAX 2D LV EF:         Left            Diastology                ventricular     LV e' medial:    15.00 cm/s                ejection        LV E/e' medial:  5.1                fraction by     LV e' lateral:   13.60 cm/s                PLAX is 61      LV E/e' lateral: 5.6                %. LVIDd:         4.60 cm LVIDs:         3.10 cm LV PW:         1.00 cm LV IVS:        1.00 cm LVOT diam:     2.00 cm LV SV:         89 LV SV Index:   40 LVOT Area:     3.14 cm  RIGHT VENTRICLE RV S  prime:     12.90 cm/s TAPSE (M-mode): 2.5 cm LEFT ATRIUM             Index        RIGHT ATRIUM           Index LA diam:        2.90 cm 1.29 cm/m   RA Area:     15.90 cm LA Vol (A2C):   49.0 ml 21.82 ml/m  RA Volume:   41.00 ml  18.26 ml/m LA Vol (A4C):   40.3 ml 17.95 ml/m LA Biplane Vol: 44.7 ml 19.91 ml/m  AORTIC  VALVE AV Area (Vmax):    2.80 cm AV Area (Vmean):   2.59 cm AV Area (VTI):     2.43 cm AV Vmax:           147.00 cm/s AV Vmean:          91.700 cm/s AV VTI:            0.367 m AV Peak Grad:      8.6 mmHg AV Mean Grad:      4.0 mmHg LVOT Vmax:         131.00 cm/s LVOT Vmean:        75.600 cm/s LVOT VTI:          0.284 m LVOT/AV VTI ratio: 0.77  AORTA Ao Root diam: 2.70 cm Ao Asc diam:  2.50 cm MITRAL VALVE MV Area (PHT): 2.83 cm    SHUNTS MV Decel Time: 268 msec    Systemic VTI:  0.28 m MV E velocity: 76.60 cm/s  Systemic Diam: 2.00 cm MV A velocity: 35.30 cm/s MV E/A ratio:  2.17 Zoila Shutter MD Electronically signed by Zoila Shutter MD Signature Date/Time: 09/12/2022/3:40:58 PM    Final    CT ABDOMEN PELVIS W CONTRAST  Result Date: 09/11/2022 CLINICAL DATA:  Abdominal pain, umbilical pain, nausea, vomiting EXAM: CT ABDOMEN AND PELVIS WITH CONTRAST TECHNIQUE: Multidetector CT imaging of the abdomen and pelvis was performed using the standard protocol following bolus administration of intravenous contrast. RADIATION DOSE REDUCTION: This exam was performed according to the departmental dose-optimization program which includes automated exposure control, adjustment of the mA and/or kV according to patient size and/or use of iterative reconstruction technique. CONTRAST:  OMNIPAQUE IOHEXOL 300 MG/ML  SOLN COMPARISON:  CT abdomen and pelvis 07/06/2018 FINDINGS: Lower chest: No acute abnormality. Hepatobiliary: Hepatic steatosis. Normal gallbladder. No biliary dilation. Pancreas: Unremarkable. Spleen: Unremarkable. Adrenals/Urinary Tract: Normal adrenal glands. No urinary calculi or hydronephrosis. Bladder is unremarkable. Stomach/Bowel: Normal caliber large and small bowel. No bowel wall thickening. The appendix is normal.Stomach is within normal limits. Vascular/Lymphatic: No significant vascular findings are present. No enlarged abdominal or pelvic lymph nodes. Reproductive: Unremarkable. Other: Small volume  free fluid in the pelvis, favored physiologic. No free intraperitoneal air. Musculoskeletal: No acute fracture. IMPRESSION: No acute abnormality in the abdomen or pelvis. Hepatic steatosis. Electronically Signed   By: Minerva Fester M.D.   On: 09/11/2022 23:00   DG Chest Portable 1 View  Result Date: 09/11/2022 CLINICAL DATA:  Syncope EXAM: PORTABLE CHEST 1 VIEW COMPARISON:  04/16/2021 FINDINGS: The heart size and mediastinal contours are within normal limits. Both lungs are clear. The visualized skeletal structures are unremarkable. IMPRESSION: Normal study. Electronically Signed   By: Charlett Nose M.D.   On: 09/11/2022 22:19    Cardiac Studies:  ECG:    Telemetry:  SB some wenkebach longest pause about 3 seconds  Echo: Normal EF 60-65%   Medications:    enoxaparin (LOVENOX) injection  40 mg Subcutaneous Q24H   famotidine  20 mg Oral Daily      lactated ringers Stopped (09/12/22 1509)    Assessment/Plan:   Bradycardia:  Dr Anne Fu reviewed with Dr Elberta Fortis EP observe consider outpatient monitor TTE with no structural heart dx Abdominal pain is vagal input. Cortisol, Lyme, Lytes and TSH normal Denies drugs  Abdominal Pain:  Prophyrins pending CT abdomen no acute findings. Consider outpatient abdominal duplex to r/o SMA stenosis ? IBS per primary service  Smoking counseled on cessation CXR NAD   Charlton Haws 09/13/2022, 10:01 AM

## 2022-09-13 NOTE — Discharge Summary (Signed)
Physician Discharge Summary  Brittany Hoffman ZOX:[id number] DOB: [birth year] DOA: 09/11/2022  PCP: Pcp, No  Admit date: 09/11/2022 Discharge date: 09/13/2022  Admitted From: Home Disposition: Home  Recommendations for Outpatient Follow-up:  Follow up with PCP in 1 week  Outpatient follow-up with cardiology and gastroenterology Follow up in ED if symptoms worsen or new appear   Home Health: No Equipment/Devices: None  Discharge Condition: Stable CODE STATUS: Full Diet recommendation: Regular  Brief/Interim Summary: 22 y.o. female with medical history significant for obesity, tobacco use disorder presented with multiple episodes of syncope.  On presentation, she had heart rate in the 30s.  Cardiology was consulted.  She was started on IV fluids.  During the hospitalization, her condition has improved.  Echo was unremarkable.  GI was consulted because of her intermittent nausea, vomiting and abdominal pain symptoms for few weeks.  She was started on oral famotidine.  Her condition has improved and she is tolerating diet.  GI and cardiology have cleared her for discharge.  Cardiology will arrange for outpatient follow-up and outpatient 30-day cardiac monitoring.  Discharge patient home today.  Discharge Diagnoses:   Syncope with symptomatic bradycardia -Unclear cause.  Cardiology thought that patient had syncopal episodes due to dysautonomia in the setting of abdominal discomfort creasing vagal tone with concomitant dehydration.  Cardiology discussed with EP and there was no indication for pacemaker. -Heart rate in the 30s on presentation.  Still slightly bradycardic but improving and currently stable. -Echo unremarkable.  Cardiology will arrange for outpatient cardiac monitoring.  Cardiology has cleared the patient for discharge. -No further syncope during hospitalization.  Patient feels okay to go home.  Discharge patient home today.  Intermittent abdominal pain, nausea and vomiting with some  diarrhea -GI evaluation and follow-up appreciated.  Patient might have had possible recent infectious enteritis with possible postinfectious bowel spasm.  She has been empirically started on oral famotidine which will be continued on discharge for 2 weeks.  GI has cleared the patient for discharge and recommended to use over-the-counter peppermint oil/IBgard for her GI symptoms.  Outpatient follow-up with GI if needed. -She is currently tolerating diet.  Obesity -Outpatient follow-up  Tobacco use disorder -Admitting hospitalist counseled regarding tobacco cessation  Discharge Instructions  Discharge Instructions     Ambulatory referral to Cardiology   Complete by: As directed    Ambulatory referral to Gastroenterology   Complete by: As directed    Hospital follow-up   What is the reason for referral?: Other      Allergies as of 09/13/2022   No Known Allergies      Medication List     TAKE these medications    famotidine 20 MG tablet Commonly known as: PEPCID Take 1 tablet (20 mg total) by mouth daily for 15 days. Start taking on: September 14, 2022   loperamide 2 MG capsule Commonly known as: IMODIUM Take 1 capsule (2 mg total) by mouth 4 (four) times daily as needed for diarrhea or loose stools.   ondansetron 4 MG tablet Commonly known as: ZOFRAN Take 1 tablet (4 mg total) by mouth every 6 (six) hours.        Follow-up Information     PCP. Schedule an appointment as soon as possible for a visit in 1 week(s).                 No Known Allergies  Consultations: Cardiology/GI   Procedures/Studies: ECHOCARDIOGRAM COMPLETE  Result Date: 09/12/2022    ECHOCARDIOGRAM REPORT   Patient  Name:   Brittany Hoffman Date of Exam: 09/12/2022 Medical Rec #:  [id number]  Height:       71.0 in Accession #:    [id number] Weight:       231.9 lb Date of Birth:  [birth year]  BSA:          2.246 m Patient Age:    22 years   BP:           109/83 mmHg Patient Gender: F          HR:            40 bpm. Exam Location:  Inpatient Procedure: 2D Echo, Color Doppler and Cardiac Doppler Indications:    Syncope  History:        Patient has no prior history of Echocardiogram examinations.                 Signs/Symptoms:Syncope.  Sonographer:    Milbert Coulter Referring Phys: [id number] CAROLE N HALL IMPRESSIONS  1. Left ventricular ejection fraction, by estimation, is 60 to 65%. Left ventricular ejection fraction by PLAX is 61 %. The left ventricle has normal function. The left ventricle has no regional wall motion abnormalities. Left ventricular diastolic parameters are indeterminate.  2. Right ventricular systolic function is normal. The right ventricular size is normal.  3. The mitral valve is abnormal. Trivial mitral valve regurgitation.  4. The aortic valve is tricuspid. Aortic valve regurgitation is not visualized.  5. The inferior vena cava is normal in size with greater than 50% respiratory variability, suggesting right atrial pressure of 3 mmHg.  6. Rhythm strip during this exam demonstrates marked sinus bradycardia. Comparison(s): No prior Echocardiogram. FINDINGS  Left Ventricle: Left ventricular ejection fraction, by estimation, is 60 to 65%. Left ventricular ejection fraction by PLAX is 61 %. The left ventricle has normal function. The left ventricle has no regional wall motion abnormalities. The left ventricular internal cavity size was normal in size. There is no left ventricular hypertrophy. Left ventricular diastolic parameters are indeterminate. Right Ventricle: The right ventricular size is normal. No increase in right ventricular wall thickness. Right ventricular systolic function is normal. Left Atrium: Left atrial size was normal in size. Right Atrium: Right atrial size was normal in size. Pericardium: There is no evidence of pericardial effusion. Mitral Valve: The mitral valve is abnormal. There is mild thickening of the anterior and posterior mitral valve leaflet(s). Trivial mitral valve  regurgitation. Tricuspid Valve: The tricuspid valve is not well visualized. Tricuspid valve regurgitation is not demonstrated. Aortic Valve: The aortic valve is tricuspid. Aortic valve regurgitation is not visualized. Aortic valve mean gradient measures 4.0 mmHg. Aortic valve peak gradient measures 8.6 mmHg. Aortic valve area, by VTI measures 2.43 cm. Pulmonic Valve: The pulmonic valve was normal in structure. Pulmonic valve regurgitation is not visualized. Aorta: The aortic root and ascending aorta are structurally normal, with no evidence of dilitation. Venous: The inferior vena cava is normal in size with greater than 50% respiratory variability, suggesting right atrial pressure of 3 mmHg. IAS/Shunts: No atrial level shunt detected by color flow Doppler. EKG: Rhythm strip during this exam demonstrates marked sinus bradycardia.  LEFT VENTRICLE PLAX 2D LV EF:         Left            Diastology                ventricular     LV e' medial:    15.00 cm/s  ejection        LV E/e' medial:  5.1                fraction by     LV e' lateral:   13.60 cm/s                PLAX is 61      LV E/e' lateral: 5.6                %. LVIDd:         4.60 cm LVIDs:         3.10 cm LV PW:         1.00 cm LV IVS:        1.00 cm LVOT diam:     2.00 cm LV SV:         89 LV SV Index:   40 LVOT Area:     3.14 cm  RIGHT VENTRICLE RV S prime:     12.90 cm/s TAPSE (M-mode): 2.5 cm LEFT ATRIUM             Index        RIGHT ATRIUM           Index LA diam:        2.90 cm 1.29 cm/m   RA Area:     15.90 cm LA Vol (A2C):   49.0 ml 21.82 ml/m  RA Volume:   41.00 ml  18.26 ml/m LA Vol (A4C):   40.3 ml 17.95 ml/m LA Biplane Vol: 44.7 ml 19.91 ml/m  AORTIC VALVE AV Area (Vmax):    2.80 cm AV Area (Vmean):   2.59 cm AV Area (VTI):     2.43 cm AV Vmax:           147.00 cm/s AV Vmean:          91.700 cm/s AV VTI:            0.367 m AV Peak Grad:      8.6 mmHg AV Mean Grad:      4.0 mmHg LVOT Vmax:         131.00 cm/s LVOT Vmean:         75.600 cm/s LVOT VTI:          0.284 m LVOT/AV VTI ratio: 0.77  AORTA Ao Root diam: 2.70 cm Ao Asc diam:  2.50 cm MITRAL VALVE MV Area (PHT): 2.83 cm    SHUNTS MV Decel Time: 268 msec    Systemic VTI:  0.28 m MV E velocity: 76.60 cm/s  Systemic Diam: 2.00 cm MV A velocity: 35.30 cm/s MV E/A ratio:  2.17 Zoila Shutter MD Electronically signed by Zoila Shutter MD Signature Date/Time: 09/12/2022/3:40:58 PM    Final    CT ABDOMEN PELVIS W CONTRAST  Result Date: 09/11/2022 CLINICAL DATA:  Abdominal pain, umbilical pain, nausea, vomiting EXAM: CT ABDOMEN AND PELVIS WITH CONTRAST TECHNIQUE: Multidetector CT imaging of the abdomen and pelvis was performed using the standard protocol following bolus administration of intravenous contrast. RADIATION DOSE REDUCTION: This exam was performed according to the departmental dose-optimization program which includes automated exposure control, adjustment of the mA and/or kV according to patient size and/or use of iterative reconstruction technique. CONTRAST:  OMNIPAQUE IOHEXOL 300 MG/ML  SOLN COMPARISON:  CT abdomen and pelvis 07/06/2018 FINDINGS: Lower chest: No acute abnormality. Hepatobiliary: Hepatic steatosis. Normal gallbladder. No biliary dilation. Pancreas: Unremarkable. Spleen: Unremarkable. Adrenals/Urinary Tract: Normal adrenal glands. No urinary calculi  or hydronephrosis. Bladder is unremarkable. Stomach/Bowel: Normal caliber large and small bowel. No bowel wall thickening. The appendix is normal.Stomach is within normal limits. Vascular/Lymphatic: No significant vascular findings are present. No enlarged abdominal or pelvic lymph nodes. Reproductive: Unremarkable. Other: Small volume free fluid in the pelvis, favored physiologic. No free intraperitoneal air. Musculoskeletal: No acute fracture. IMPRESSION: No acute abnormality in the abdomen or pelvis. Hepatic steatosis. Electronically Signed   By: Minerva Fester M.D.   On: 09/11/2022 23:00   DG Chest  Portable 1 View  Result Date: 09/11/2022 CLINICAL DATA:  Syncope EXAM: PORTABLE CHEST 1 VIEW COMPARISON:  04/16/2021 FINDINGS: The heart size and mediastinal contours are within normal limits. Both lungs are clear. The visualized skeletal structures are unremarkable. IMPRESSION: Normal study. Electronically Signed   By: Charlett Nose M.D.   On: 09/11/2022 22:19      Subjective: Patient seen and examined at bedside.  Tolerating diet.  Feels better.  Feels okay to go home today.  Denies worsening abdominal pain or vomiting.  No more syncope since admission.  Discharge Exam: Vitals:   09/13/22 0630 09/13/22 0934  BP: 102/71 115/68  Pulse: (!) 42 (!) 58  Resp: 15 18  Temp:  97.6 F (36.4 C)  SpO2: 100% 100%    General: Pt is alert, awake, not in acute distress Cardiovascular: Intermittent bradycardia present; S1/S2 + Respiratory: bilateral decreased breath sounds at bases Abdominal: Soft, obese, NT, ND, bowel sounds + Extremities: no edema, no cyanosis    The results of significant diagnostics from this hospitalization (including imaging, microbiology, ancillary and laboratory) are listed below for reference.     Microbiology: Recent Results (from the past 240 hour(s))  Resp panel by RT-PCR (RSV, Flu A&B, Covid) Anterior Nasal Swab     Status: None   Collection Time: 09/11/22  9:11 PM   Specimen: Anterior Nasal Swab  Result Value Ref Range Status   SARS Coronavirus 2 by RT PCR NEGATIVE NEGATIVE Final    Comment: (NOTE) SARS-CoV-2 target nucleic acids are NOT DETECTED.  The SARS-CoV-2 RNA is generally detectable in upper respiratory specimens during the acute phase of infection. The lowest concentration of SARS-CoV-2 viral copies this assay can detect is 138 copies/mL. A negative result does not preclude SARS-Cov-2 infection and should not be used as the sole basis for treatment or other patient management decisions. A negative result may occur with  improper specimen  collection/handling, submission of specimen other than nasopharyngeal swab, presence of viral mutation(s) within the areas targeted by this assay, and inadequate number of viral copies(<138 copies/mL). A negative result must be combined with clinical observations, patient history, and epidemiological information. The expected result is Negative.  Fact Sheet for Patients:  BloggerCourse.com  Fact Sheet for Healthcare Providers:  SeriousBroker.it  This test is no t yet approved or cleared by the [place] FDA and  has been authorized for detection and/or diagnosis of SARS-CoV-2 by FDA under an Emergency Use Authorization (EUA). This EUA will remain  in effect (meaning this test can be used) for the duration of the COVID-19 declaration under Section 564(b)(1) of the Act, 21 U.S.C.section 360bbb-3(b)(1), unless the authorization is terminated  or revoked sooner.       Influenza A by PCR NEGATIVE NEGATIVE Final   Influenza B by PCR NEGATIVE NEGATIVE Final    Comment: (NOTE) The Xpert Xpress SARS-CoV-2/FLU/RSV plus assay is intended as an aid in the diagnosis of influenza from Nasopharyngeal swab specimens and should not be used  as a sole basis for treatment. Nasal washings and aspirates are unacceptable for Xpert Xpress SARS-CoV-2/FLU/RSV testing.  Fact Sheet for Patients: BloggerCourse.com  Fact Sheet for Healthcare Providers: SeriousBroker.it  This test is not yet approved or cleared by the [place] FDA and has been authorized for detection and/or diagnosis of SARS-CoV-2 by FDA under an Emergency Use Authorization (EUA). This EUA will remain in effect (meaning this test can be used) for the duration of the COVID-19 declaration under Section 564(b)(1) of the Act, 21 U.S.C. section 360bbb-3(b)(1), unless the authorization is terminated or revoked.     Resp Syncytial  Virus by PCR NEGATIVE NEGATIVE Final    Comment: (NOTE) Fact Sheet for Patients: BloggerCourse.com  Fact Sheet for Healthcare Providers: SeriousBroker.it  This test is not yet approved or cleared by the [place] FDA and has been authorized for detection and/or diagnosis of SARS-CoV-2 by FDA under an Emergency Use Authorization (EUA). This EUA will remain in effect (meaning this test can be used) for the duration of the COVID-19 declaration under Section 564(b)(1) of the Act, 21 U.S.C. section 360bbb-3(b)(1), unless the authorization is terminated or revoked.  Performed at [facility], [address] [place], [place] [address]      Labs: BNP (last 3 results) No results for input(s): "BNP" in the last 8760 hours. Basic Metabolic Panel: Recent Labs  Lab 09/08/22 1918 09/11/22 2056 09/12/22 0301 09/12/22 0353  NA 140 138 139 138  K 3.9 3.4* 4.1 3.9  CL 106 104 106 105  CO2 22 24 25 24   GLUCOSE 163* 115* 140* 101*  BUN 7 6 6  <5*  CREATININE 1.35* 1.03* 1.02* 0.92  CALCIUM 8.5* 8.3* 8.4* 8.4*  MG 2.0  --  1.7 1.7  PHOS  --   --  4.0 4.2   Liver Function Tests: Recent Labs  Lab 09/08/22 1918 09/11/22 2056  AST 14* 11*  ALT 13 14  ALKPHOS 38 33*  BILITOT 0.8 0.5  PROT 5.9* 5.6*  ALBUMIN 3.1* 3.2*   Recent Labs  Lab 09/08/22 1918 09/11/22 2116  LIPASE 23 27   No results for input(s): "AMMONIA" in the last 168 hours. CBC: Recent Labs  Lab 09/08/22 1918 09/11/22 2056 09/12/22 0301 09/12/22 0353 09/13/22 0414  WBC 7.5 8.4 8.9 8.8 6.4  NEUTROABS  --  5.4  --   --   --   HGB 16.3* 15.3* 13.7 15.4* 13.1  HCT 50.6* 47.0* 42.7 46.0 40.0  MCV 91.2 91.3 92.8 91.3 90.3  PLT 302 283 246 214 237   Cardiac Enzymes: No results for input(s): "CKTOTAL", "CKMB", "CKMBINDEX", "TROPONINI" in the last 168 hours. BNP: Invalid input(s): "POCBNP" CBG: No results for input(s): "GLUCAP" in the  last 168 hours. D-Dimer No results for input(s): "DDIMER" in the last 72 hours. Hgb A1c No results for input(s): "HGBA1C" in the last 72 hours. Lipid Profile No results for input(s): "CHOL", "HDL", "LDLCALC", "TRIG", "CHOLHDL", "LDLDIRECT" in the last 72 hours. Thyroid function studies Recent Labs    09/12/22 0353  TSH 1.566   Anemia work up No results for input(s): "VITAMINB12", "FOLATE", "FERRITIN", "TIBC", "IRON", "RETICCTPCT" in the last 72 hours. Urinalysis    Component Value Date/Time   COLORURINE YELLOW 09/08/2022 2130   APPEARANCEUR CLEAR 09/08/2022 2130   LABSPEC 1.008 09/08/2022 2130   PHURINE 6.0 09/08/2022 2130   GLUCOSEU NEGATIVE 09/08/2022 2130   HGBUR NEGATIVE 09/08/2022 2130   BILIRUBINUR NEGATIVE 09/08/2022 2130   KETONESUR NEGATIVE 09/08/2022 2130  PROTEINUR NEGATIVE 09/08/2022 2130   NITRITE NEGATIVE 09/08/2022 2130   LEUKOCYTESUR NEGATIVE 09/08/2022 2130   Sepsis Labs Recent Labs  Lab 09/11/22 2056 09/12/22 0301 09/12/22 0353 09/13/22 0414  WBC 8.4 8.9 8.8 6.4   Microbiology Recent Results (from the past 240 hour(s))  Resp panel by RT-PCR (RSV, Flu A&B, Covid) Anterior Nasal Swab     Status: None   Collection Time: 09/11/22  9:11 PM   Specimen: Anterior Nasal Swab  Result Value Ref Range Status   SARS Coronavirus 2 by RT PCR NEGATIVE NEGATIVE Final    Comment: (NOTE) SARS-CoV-2 target nucleic acids are NOT DETECTED.  The SARS-CoV-2 RNA is generally detectable in upper respiratory specimens during the acute phase of infection. The lowest concentration of SARS-CoV-2 viral copies this assay can detect is 138 copies/mL. A negative result does not preclude SARS-Cov-2 infection and should not be used as the sole basis for treatment or other patient management decisions. A negative result may occur with  improper specimen collection/handling, submission of specimen other than nasopharyngeal swab, presence of viral mutation(s) within the areas  targeted by this assay, and inadequate number of viral copies(<138 copies/mL). A negative result must be combined with clinical observations, patient history, and epidemiological information. The expected result is Negative.  Fact Sheet for Patients:  BloggerCourse.com  Fact Sheet for Healthcare Providers:  SeriousBroker.it  This test is no t yet approved or cleared by the [place] FDA and  has been authorized for detection and/or diagnosis of SARS-CoV-2 by FDA under an Emergency Use Authorization (EUA). This EUA will remain  in effect (meaning this test can be used) for the duration of the COVID-19 declaration under Section 564(b)(1) of the Act, 21 U.S.C.section 360bbb-3(b)(1), unless the authorization is terminated  or revoked sooner.       Influenza A by PCR NEGATIVE NEGATIVE Final   Influenza B by PCR NEGATIVE NEGATIVE Final    Comment: (NOTE) The Xpert Xpress SARS-CoV-2/FLU/RSV plus assay is intended as an aid in the diagnosis of influenza from Nasopharyngeal swab specimens and should not be used as a sole basis for treatment. Nasal washings and aspirates are unacceptable for Xpert Xpress SARS-CoV-2/FLU/RSV testing.  Fact Sheet for Patients: BloggerCourse.com  Fact Sheet for Healthcare Providers: SeriousBroker.it  This test is not yet approved or cleared by the [place] FDA and has been authorized for detection and/or diagnosis of SARS-CoV-2 by FDA under an Emergency Use Authorization (EUA). This EUA will remain in effect (meaning this test can be used) for the duration of the COVID-19 declaration under Section 564(b)(1) of the Act, 21 U.S.C. section 360bbb-3(b)(1), unless the authorization is terminated or revoked.     Resp Syncytial Virus by PCR NEGATIVE NEGATIVE Final    Comment: (NOTE) Fact Sheet for  Patients: BloggerCourse.com  Fact Sheet for Healthcare Providers: SeriousBroker.it  This test is not yet approved or cleared by the [place] FDA and has been authorized for detection and/or diagnosis of SARS-CoV-2 by FDA under an Emergency Use Authorization (EUA). This EUA will remain in effect (meaning this test can be used) for the duration of the COVID-19 declaration under Section 564(b)(1) of the Act, 21 U.S.C. section 360bbb-3(b)(1), unless the authorization is terminated or revoked.  Performed at [facility], [address], [place], [place] [address]      Time coordinating discharge: 35 minutes  SIGNED:   Glade Lloyd, MD  [facility] 09/13/2022, 11:16 AM

## 2022-09-13 NOTE — Progress Notes (Signed)
Approximately 10:35-- This RN entered patient's to assess patient. Pt's visitor was observed sitting next to her in a recliner and appeared  fatigued and lethargic. Pt's visitor stated he was "just tired" and had not been able to sleep while visiting patient. He was observed slurring words and unable to keep his eyes open for an extended period of time.   Charge RN, Alvira Philips, notified and Security called. AC arrived at bedside and determined visitor would benefit from leaving the hospital for at least 2-4 hours to go home and rest. Pt's visitor was agreeable to leaving and was advised to have someone drive him home. Visitor was escorted out of the hospital with security.  Three cigars and one lighter were previously confiscated from pt's room and placed in pt's hard chart with pt label. Visitor requested these belongings and was provided them prior to leaving.

## 2022-09-13 NOTE — Plan of Care (Signed)

## 2022-09-13 NOTE — Progress Notes (Signed)
Discharge instructions (including medications) discussed with and copy provided to patient. Patient verbalized understanding. PIV removed and patient dressing herself. Waiting for ride to discharge home.

## 2022-09-13 NOTE — Progress Notes (Signed)
[facility] Progress Note  CC:  Nausea, vomiting and abdominal pain   Subjective: No nausea or vomiting.  She is tolerating a regular diet.  She had mild pain below the umbilicus earlier this morning which abated after she went to the bathroom and passed a watery green nonbloody diarrhea bowel movement.  No abdominal pain at this time.  No dizziness or lightheadedness when ambulating to the bathroom.  No chest pain or shortness of breath.  Significant other at the bedside   Objective:  Vital signs in last 24 hours: Temp:  [97.6 F (36.4 C)-98 F (36.7 C)] 98 F (36.7 C) (09/07 0400) Pulse Rate:  [35-65] 42 (09/07 0630) Resp:  [11-20] 15 (09/07 0630) BP: (92-143)/(56-97) 102/71 (09/07 0630) SpO2:  [99 %-100 %] 100 % (09/07 0630) Weight:  [105.2 kg] 105.2 kg (09/07 0400) Last BM Date : 09/11/22 General: Alert 22 year old female in no acute distress. Heart: Bradycardic, no murmurs. Pulm: Sounds clear throughout. Abdomen: Soft, nondistended.  Nontender.  Positive bowel sounds to all 4 quadrants. Extremities:  Without edema. Neurologic:  Alert and  oriented x 4. Grossly normal neurologically. Psych:  Alert and cooperative. Normal mood and affect.  Intake/Output from previous day: 09/06 0701 - 09/07 0700 In: 1155.8 [P.O.:570; I.V.:485.8; IV Piggyback:100] Out: 1875 [Urine:1875] Intake/Output this shift: No intake/output data recorded.  Lab Results: Recent Labs    09/12/22 0301 09/12/22 0353 09/13/22 0414  WBC 8.9 8.8 6.4  HGB 13.7 15.4* 13.1  HCT 42.7 46.0 40.0  PLT 246 214 237   BMET Recent Labs    09/11/22 2056 09/12/22 0301 09/12/22 0353  NA 138 139 138  K 3.4* 4.1 3.9  CL 104 106 105  CO2 24 25 24   GLUCOSE 115* 140* 101*  BUN 6 6 <5*  CREATININE 1.03* 1.02* 0.92  CALCIUM 8.3* 8.4* 8.4*   LFT Recent Labs    09/11/22 2056  PROT 5.6*  ALBUMIN 3.2*  AST 11*  ALT 14  ALKPHOS 33*  BILITOT 0.5   PT/INR No results for input(s):  "LABPROT", "INR" in the last 72 hours. Hepatitis Panel No results for input(s): "HEPBSAG", "HCVAB", "HEPAIGM", "HEPBIGM" in the last 72 hours.  ECHOCARDIOGRAM COMPLETE  Result Date: 09/12/2022    ECHOCARDIOGRAM REPORT   Patient Name:   [facility] Date of Exam: 09/12/2022 Medical Rec #:  [id number]  Height:       71.0 in Accession #:    [id number] Weight:       231.9 lb Date of Birth:  2000/04/17  BSA:          2.246 m Patient Age:    22 years   BP:           109/83 mmHg Patient Gender: F          HR:           40 bpm. Exam Location:  Inpatient Procedure: 2D Echo, Color Doppler and Cardiac Doppler Indications:    Syncope  History:        Patient has no prior history of Echocardiogram examinations.                 Signs/Symptoms:Syncope.  Sonographer:    Milbert Coulter Referring Phys: [id number] CAROLE N HALL IMPRESSIONS  1. Left ventricular ejection fraction, by estimation, is 60 to 65%. Left ventricular ejection fraction by PLAX is 61 %. The left ventricle has normal function. The left ventricle has no regional wall motion abnormalities. Left ventricular  diastolic parameters are indeterminate.  2. Right ventricular systolic function is normal. The right ventricular size is normal.  3. The mitral valve is abnormal. Trivial mitral valve regurgitation.  4. The aortic valve is tricuspid. Aortic valve regurgitation is not visualized.  5. The inferior vena cava is normal in size with greater than 50% respiratory variability, suggesting right atrial pressure of 3 mmHg.  6. Rhythm strip during this exam demonstrates marked sinus bradycardia. Comparison(s): No prior Echocardiogram. FINDINGS  Left Ventricle: Left ventricular ejection fraction, by estimation, is 60 to 65%. Left ventricular ejection fraction by PLAX is 61 %. The left ventricle has normal function. The left ventricle has no regional wall motion abnormalities. The left ventricular internal cavity size was normal in size. There is no left ventricular hypertrophy.  Left ventricular diastolic parameters are indeterminate. Right Ventricle: The right ventricular size is normal. No increase in right ventricular wall thickness. Right ventricular systolic function is normal. Left Atrium: Left atrial size was normal in size. Right Atrium: Right atrial size was normal in size. Pericardium: There is no evidence of pericardial effusion. Mitral Valve: The mitral valve is abnormal. There is mild thickening of the anterior and posterior mitral valve leaflet(s). Trivial mitral valve regurgitation. Tricuspid Valve: The tricuspid valve is not well visualized. Tricuspid valve regurgitation is not demonstrated. Aortic Valve: The aortic valve is tricuspid. Aortic valve regurgitation is not visualized. Aortic valve mean gradient measures 4.0 mmHg. Aortic valve peak gradient measures 8.6 mmHg. Aortic valve area, by VTI measures 2.43 cm. Pulmonic Valve: The pulmonic valve was normal in structure. Pulmonic valve regurgitation is not visualized. Aorta: The aortic root and ascending aorta are structurally normal, with no evidence of dilitation. Venous: The inferior vena cava is normal in size with greater than 50% respiratory variability, suggesting right atrial pressure of 3 mmHg. IAS/Shunts: No atrial level shunt detected by color flow Doppler. EKG: Rhythm strip during this exam demonstrates marked sinus bradycardia.  LEFT VENTRICLE PLAX 2D LV EF:         Left            Diastology                ventricular     LV e' medial:    15.00 cm/s                ejection        LV E/e' medial:  5.1                fraction by     LV e' lateral:   13.60 cm/s                PLAX is 61      LV E/e' lateral: 5.6                %. LVIDd:         4.60 cm LVIDs:         3.10 cm LV PW:         1.00 cm LV IVS:        1.00 cm LVOT diam:     2.00 cm LV SV:         89 LV SV Index:   40 LVOT Area:     3.14 cm  RIGHT VENTRICLE RV S prime:     12.90 cm/s TAPSE (M-mode): 2.5 cm LEFT ATRIUM             Index  RIGHT  ATRIUM           Index LA diam:        2.90 cm 1.29 cm/m   RA Area:     15.90 cm LA Vol (A2C):   49.0 ml 21.82 ml/m  RA Volume:   41.00 ml  18.26 ml/m LA Vol (A4C):   40.3 ml 17.95 ml/m LA Biplane Vol: 44.7 ml 19.91 ml/m  AORTIC VALVE AV Area (Vmax):    2.80 cm AV Area (Vmean):   2.59 cm AV Area (VTI):     2.43 cm AV Vmax:           147.00 cm/s AV Vmean:          91.700 cm/s AV VTI:            0.367 m AV Peak Grad:      8.6 mmHg AV Mean Grad:      4.0 mmHg LVOT Vmax:         131.00 cm/s LVOT Vmean:        75.600 cm/s LVOT VTI:          0.284 m LVOT/AV VTI ratio: 0.77  AORTA Ao Root diam: 2.70 cm Ao Asc diam:  2.50 cm MITRAL VALVE MV Area (PHT): 2.83 cm    SHUNTS MV Decel Time: 268 msec    Systemic VTI:  0.28 m MV E velocity: 76.60 cm/s  Systemic Diam: 2.00 cm MV A velocity: 35.30 cm/s MV E/A ratio:  2.17 Zoila Shutter MD Electronically signed by Zoila Shutter MD Signature Date/Time: 09/12/2022/3:40:58 PM    Final    CT ABDOMEN PELVIS W CONTRAST  Result Date: 09/11/2022 CLINICAL DATA:  Abdominal pain, umbilical pain, nausea, vomiting EXAM: CT ABDOMEN AND PELVIS WITH CONTRAST TECHNIQUE: Multidetector CT imaging of the abdomen and pelvis was performed using the standard protocol following bolus administration of intravenous contrast. RADIATION DOSE REDUCTION: This exam was performed according to the departmental dose-optimization program which includes automated exposure control, adjustment of the mA and/or kV according to patient size and/or use of iterative reconstruction technique. CONTRAST:  OMNIPAQUE IOHEXOL 300 MG/ML  SOLN COMPARISON:  CT abdomen and pelvis 07/06/2018 FINDINGS: Lower chest: No acute abnormality. Hepatobiliary: Hepatic steatosis. Normal gallbladder. No biliary dilation. Pancreas: Unremarkable. Spleen: Unremarkable. Adrenals/Urinary Tract: Normal adrenal glands. No urinary calculi or hydronephrosis. Bladder is unremarkable. Stomach/Bowel: Normal caliber large and small bowel. No  bowel wall thickening. The appendix is normal.Stomach is within normal limits. Vascular/Lymphatic: No significant vascular findings are present. No enlarged abdominal or pelvic lymph nodes. Reproductive: Unremarkable. Other: Small volume free fluid in the pelvis, favored physiologic. No free intraperitoneal air. Musculoskeletal: No acute fracture. IMPRESSION: No acute abnormality in the abdomen or pelvis. Hepatic steatosis. Electronically Signed   By: Minerva Fester M.D.   On: 09/11/2022 23:00   DG Chest Portable 1 View  Result Date: 09/11/2022 CLINICAL DATA:  Syncope EXAM: PORTABLE CHEST 1 VIEW COMPARISON:  04/16/2021 FINDINGS: The heart size and mediastinal contours are within normal limits. Both lungs are clear. The visualized skeletal structures are unremarkable. IMPRESSION: Normal study. Electronically Signed   By: Charlett Nose M.D.   On: 09/11/2022 22:19    Assessment / Plan:  22 year old female subumbilical abdominal pain and nonbloody diarrhea x 3 weeks with intermittent N/V x 1 week with associated syncope. CTAP without intra-abdominal/pelvic pathology to explain her symptoms.  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 Hg 13.1.  WBC 6.4.  CRP 0.8. Sed rate 1. -Await  GI pathogen panel, patient had one diarrhea BM this morning but specimen was not collected. Hat placed in commode, RN to collect stool specimen after next BM -CRP, sed rate in a.m. -Continue Famotidine 20 mg p.o. daily -Continue Compazine 5 mg IV every 6 hours if okay with cardiology -Heart healthy diet as tolerated -No plans for endoscopic evaluation at this time   Recurrent syncope, occurs after change of position type factorial: Decreased p.o. intake, orthostatic hypotension, bradycardia. Possible dysautonomia vagal stimulation from abdominal issue per cardiology. Patient indicated syncope occurs after change of position and does not occur when experiencing active abdominal pain or diarrhea.     Bradycardia, etiology unclear. HR 40's. 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  Echo showed LVEF 60 to 65%.  Trivial MVR. -Seen by cardiology, ordered Lyme titer    Hepatic steatosis per CTAP.  Normal LFTs. -Recommend outpatient follow-up   Principal Problem:   Syncope and collapse Active Problems:   Lower abdominal pain   Diarrhea   Bradycardia     LOS: 1 day   Arnaldo Natal  09/13/2022, 9:31AM

## 2022-09-15 ENCOUNTER — Encounter: Payer: Self-pay | Admitting: *Deleted

## 2022-09-15 ENCOUNTER — Other Ambulatory Visit: Payer: Self-pay | Admitting: *Deleted

## 2022-09-15 DIAGNOSIS — R55 Syncope and collapse: Secondary | ICD-10-CM

## 2022-09-15 DIAGNOSIS — I441 Atrioventricular block, second degree: Secondary | ICD-10-CM

## 2022-09-15 DIAGNOSIS — R001 Bradycardia, unspecified: Secondary | ICD-10-CM

## 2022-09-15 NOTE — Progress Notes (Signed)
Patient enrolled for [employer]/ [employer] to ship a 30 day cardiac event monitor to her address on file.  Letter with instructions mailed to patient.  Dr. Anne Fu to read.

## 2022-09-27 ENCOUNTER — Emergency Department (HOSPITAL_COMMUNITY)
Admission: EM | Admit: 2022-09-27 | Discharge: 2022-09-28 | Disposition: A | Discharge status: Home / Self Care | Payer: Medicaid Other | Attending: Emergency Medicine | Admitting: Emergency Medicine

## 2022-09-27 ENCOUNTER — Other Ambulatory Visit: Payer: Self-pay

## 2022-09-27 ENCOUNTER — Encounter (HOSPITAL_COMMUNITY): Payer: Self-pay | Admitting: *Deleted

## 2022-09-27 DIAGNOSIS — I441 Atrioventricular block, second degree: Secondary | ICD-10-CM

## 2022-09-27 DIAGNOSIS — Z1152 Encounter for screening for COVID-19: Secondary | ICD-10-CM | POA: Insufficient documentation

## 2022-09-27 DIAGNOSIS — R112 Nausea with vomiting, unspecified: Secondary | ICD-10-CM | POA: Insufficient documentation

## 2022-09-27 DIAGNOSIS — D72829 Elevated white blood cell count, unspecified: Secondary | ICD-10-CM | POA: Insufficient documentation

## 2022-09-27 DIAGNOSIS — R55 Syncope and collapse: Secondary | ICD-10-CM

## 2022-09-27 DIAGNOSIS — E876 Hypokalemia: Secondary | ICD-10-CM | POA: Insufficient documentation

## 2022-09-27 DIAGNOSIS — R001 Bradycardia, unspecified: Secondary | ICD-10-CM

## 2022-09-27 NOTE — ED Triage Notes (Signed)
Pt reports lower abdominal pain for about 2 days, unable to hold anything down for about a week.

## 2022-09-28 LAB — URINALYSIS, ROUTINE W REFLEX MICROSCOPIC
Bilirubin Urine: NEGATIVE
Glucose, UA: NEGATIVE mg/dL
Hgb urine dipstick: NEGATIVE
Ketones, ur: 80 mg/dL — AB
Leukocytes,Ua: NEGATIVE
Nitrite: NEGATIVE
Protein, ur: NEGATIVE mg/dL
Specific Gravity, Urine: 1.019 (ref 1.005–1.030)
pH: 7 (ref 5.0–8.0)

## 2022-09-28 LAB — COMPREHENSIVE METABOLIC PANEL
ALT: 16 U/L (ref 0–44)
AST: 13 U/L — ABNORMAL LOW (ref 15–41)
Albumin: 3.1 g/dL — ABNORMAL LOW (ref 3.5–5.0)
Alkaline Phosphatase: 40 U/L (ref 38–126)
Anion gap: 10 (ref 5–15)
BUN: <5 mg/dL — ABNORMAL LOW (ref 6–20)
CO2: 25 mmol/L (ref 22–32)
Calcium: 7.9 mg/dL — ABNORMAL LOW (ref 8.9–10.3)
Chloride: 106 mmol/L (ref 98–111)
Creatinine, Ser: 0.76 mg/dL (ref 0.44–1.00)
GFR, Estimated: >60 mL/min (ref >60)
Glucose, Bld: 111 mg/dL — ABNORMAL HIGH (ref 70–99)
Potassium: 3.1 mmol/L — ABNORMAL LOW (ref 3.5–5.1)
Sodium: 141 mmol/L (ref 135–145)
Total Bilirubin: 0.6 mg/dL (ref 0.3–1.2)
Total Protein: 5.7 g/dL — ABNORMAL LOW (ref 6.5–8.1)

## 2022-09-28 LAB — CBC
HCT: 39.3 % (ref 36.0–46.0)
Hemoglobin: 13 g/dL (ref 12.0–15.0)
MCH: 30.4 pg (ref 26.0–34.0)
MCHC: 33.1 g/dL (ref 30.0–36.0)
MCV: 92 fL (ref 80.0–100.0)
Platelets: 330 10*3/uL (ref 150–400)
RBC: 4.27 MIL/uL (ref 3.87–5.11)
RDW: 13 % (ref 11.5–15.5)
WBC: 13.8 10*3/uL — ABNORMAL HIGH (ref 4.0–10.5)
nRBC: 0 % (ref 0.0–0.2)

## 2022-09-28 LAB — LIPASE, BLOOD: Lipase: 25 U/L (ref 11–51)

## 2022-09-28 LAB — HCG, SERUM, QUALITATIVE: Preg, Serum: NEGATIVE

## 2022-09-28 LAB — SARS CORONAVIRUS 2 BY RT PCR: SARS Coronavirus 2 by RT PCR: NEGATIVE

## 2022-09-28 MED ORDER — ONDANSETRON 4 MG PO TBDP: 4.0000 mg | ORAL_TABLET | Freq: Three times a day (TID) | ORAL | 0 refills | Status: DC | PRN

## 2022-09-28 MED ORDER — ONDANSETRON HCL 4 MG/2ML IJ SOLN
4.0000 mg | Freq: Once | INTRAMUSCULAR | Status: AC
  Administered 2022-09-28: 4 mg via INTRAVENOUS
  Filled 2022-09-28: qty 2

## 2022-09-28 MED ORDER — POTASSIUM CHLORIDE CRYS ER 20 MEQ PO TBCR: 20.0000 meq | EXTENDED_RELEASE_TABLET | Freq: Two times a day (BID) | ORAL | 0 refills | Status: DC

## 2022-09-28 NOTE — ED Provider Notes (Signed)
[facility] Provider Note   CSN: [id number] Arrival date & time: 09/27/22  2327     History  Chief Complaint  Patient presents with   Emesis    Br[facility] Hoffman is a 22 y.o. female who presnet swith 2 days of nausea and 2 episodes of NBNB emesis today. Aching pain in the lower abodmen, urinary frequency x 3 days. NO fevers or chills.  Patient states that she "cannot walk or get up or do nothing because I feel bad".  Unable to provide any further insight to what this means.  States that her symptoms are not isolated nausea but unable to identify any other symptoms for this provider. 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  Additionally was evaluated by CT scan at that time for vague abdominal discomfort and nausea, unremarkable.  GI consult obtained and patient and patient was cleared to start on famotidine which she has been noncompliant with.  States that she vomits it up every time she takes it, but also states that she has not been vomiting for the last several days and subsequently endorses that she has not been taking the medication as prescribed.  Discharge from the hospital on 9/7.  Denies known sick contacts. Denies marijuana or other illicit substance use.  HPI     Home Medications Prior to Admission medications   Medication Sig Start Date End Date Taking? Authorizing Provider  ondansetron (ZOFRAN-ODT) 4 MG disintegrating tablet Take 1 tablet (4 mg total) by mouth every 8 (eight) hours as needed for nausea or vomiting. 09/28/22  Yes Reylene Stauder R, PA-C  potassium chloride SA (KLOR-CON M) 20 MEQ tablet Take 1 tablet (20 mEq total) by mouth 2 (two) times daily. 09/28/22  Yes Valoria Tamburri R, PA-C  famotidine (PEPCID) 20 MG tablet Take 1 tablet (20 mg total) by mouth daily for 15 days. 09/14/22 09/29/22  Glade Lloyd, MD  loperamide (IMODIUM) 2 MG  capsule Take 1 capsule (2 mg total) by mouth 4 (four) times daily as needed for diarrhea or loose stools. Patient not taking: Reported on 09/11/2022 09/08/22   Elayne Snare K, DO      Allergies    Patient has no known allergies.    Review of Systems   Review of Systems  Constitutional:  Positive for appetite change and fatigue.  Gastrointestinal:  Positive for abdominal pain, constipation, nausea and vomiting. Negative for diarrhea.  Genitourinary:  Positive for frequency. Negative for dysuria, vaginal bleeding and vaginal discharge.  Neurological: Negative.     Physical Exam Updated Vital Signs BP 130/75 (BP Location: Left Arm)   Pulse 74   Temp 98.8 F (37.1 C) (Oral)   Resp 14   Ht 5\' 11"  (1.803 m)   Wt 104.3 kg   SpO2 100%   BMI 32.08 kg/m  Physical Exam Vitals and nursing note reviewed.  Constitutional:      Appearance: She is not toxic-appearing.  HENT:     Head: Normocephalic and atraumatic.     Mouth/Throat:     Mouth: Mucous membranes are moist.     Pharynx: No oropharyngeal exudate or posterior oropharyngeal erythema.  Eyes:     General:        Right eye: No discharge.        Left eye: No discharge.     Conjunctiva/sclera: Conjunctivae normal.  Cardiovascular:     Rate  and Rhythm: Normal rate and regular rhythm.     Pulses: Normal pulses.     Heart sounds: Murmur heard.  Pulmonary:     Effort: Pulmonary effort is normal. No respiratory distress.     Breath sounds: Normal breath sounds. No wheezing or rales.  Abdominal:     General: Bowel sounds are normal. There is no distension.     Palpations: Abdomen is soft.     Tenderness: There is no abdominal tenderness. There is no right CVA tenderness, left CVA tenderness, guarding or rebound.  Musculoskeletal:        General: No deformity.     Cervical back: Neck supple.  Skin:    General: Skin is warm and dry.  Neurological:     Mental Status: She is alert. Mental status is at baseline.  Psychiatric:         Mood and Affect: Mood normal.     ED Results / Procedures / Treatments   Labs (all labs ordered are listed, but only abnormal results are displayed) Labs Reviewed  COMPREHENSIVE METABOLIC PANEL - Abnormal; Notable for the following components:      Result Value   Potassium 3.1 (*)    Glucose, Bld 111 (*)    BUN <5 (*)    Calcium 7.9 (*)    Total Protein 5.7 (*)    Albumin 3.1 (*)    AST 13 (*)    All other components within normal limits  CBC - Abnormal; Notable for the following components:   WBC 13.8 (*)    All other components within normal limits  URINALYSIS, ROUTINE W REFLEX MICROSCOPIC - Abnormal; Notable for the following components:   APPearance HAZY (*)    Ketones, ur 80 (*)    All other components within normal limits  SARS CORONAVIRUS 2 BY RT PCR  LIPASE, BLOOD  HCG, SERUM, QUALITATIVE    EKG None  Radiology No results found.  Procedures Procedures    Medications Ordered in ED Medications  ondansetron (ZOFRAN) injection 4 mg (4 mg Intravenous Given 09/28/22 0113)    ED Course/ Medical Decision Making/ A&P                                 Medical Decision Making 81 female with complaint of vague abdominal pain and nausea and vomiting today.  No fevers or chills.  Pulmonary exam unremarkable, abdominal exam is benign.  Dry heaving in ED but no recurrent vomiting.  Amount and/or Complexity of Data Reviewed Labs: ordered.    Details: CBC without anemia but with mild leukocytosis of 13.8, CMP with hypokalemia of 3.1 UA without evidence of infection, lipase is normal, patient is not pregnant.  COVID test pending.  Risk Prescription drug management.   Reassuring abdominal exam without tenderness palpation, reassuring laboratory studies.  CT scan in the past couple of weeks due to similar pain unremarkable.  Do not feel any imaging is warranted this evening given benign abdominal exam and patient who seems symptomatic at time of  reevaluation.  Clinical concern for emergent underlying etiology of this patient symptoms that would warrant further ED workup or inpatient management is exceedingly low.  Favor possible viral etiology.  [facility] voiced understanding of her medical evaluation and treatment plan. Each of their questions answered to their expressed satisfaction.  Return precautions were given.  Patient is well-appearing, stable, and was discharged in good condition.  This chart was  dictated using voice recognition software, Dragon. Despite the best efforts of this provider to proofread and correct errors, errors may still occur which can change documentation meaning.         Final Clinical Impression(s) / ED Diagnoses Final diagnoses:  Nausea and vomiting, unspecified vomiting type    Rx / DC Orders ED Discharge Orders          Ordered    ondansetron (ZOFRAN-ODT) 4 MG disintegrating tablet  Every 8 hours PRN        09/28/22 0321    potassium chloride SA (KLOR-CON M) 20 MEQ tablet  2 times daily        09/28/22 0323              Chester Romero, Eugene Gavia, PA-C 09/28/22 0326    Glynn Octave, MD 09/28/22 0730

## 2022-09-28 NOTE — Discharge Instructions (Addendum)
You may have a viral illness causing your symptoms.Increase your hydration, you  may use over the counter anti nausea medication. Follow up with the clinic listed below and return to the ER with any new severe symptoms. You may follow your test results for COVID in your mychart account. Please take the prescribed potassium as your potassium levels were low.

## 2022-10-30 ENCOUNTER — Ambulatory Visit: Payer: No Typology Code available for payment source | Attending: Cardiovascular Disease

## 2022-10-30 DIAGNOSIS — I441 Atrioventricular block, second degree: Secondary | ICD-10-CM

## 2022-10-30 DIAGNOSIS — R001 Bradycardia, unspecified: Secondary | ICD-10-CM

## 2022-10-30 DIAGNOSIS — R55 Syncope and collapse: Secondary | ICD-10-CM

## 2022-11-14 ENCOUNTER — Ambulatory Visit: Payer: Medicaid Other | Admitting: Cardiovascular Disease

## 2022-11-20 ENCOUNTER — Encounter: Payer: Self-pay | Admitting: *Deleted

## 2023-04-20 ENCOUNTER — Emergency Department (HOSPITAL_COMMUNITY)
Admission: EM | Admit: 2023-04-20 | Discharge: 2023-04-21 | Disposition: A | Discharge status: Home / Self Care | Payer: Self-pay | Attending: Emergency Medicine | Admitting: Emergency Medicine

## 2023-04-20 ENCOUNTER — Other Ambulatory Visit: Payer: Self-pay

## 2023-04-20 ENCOUNTER — Encounter (HOSPITAL_COMMUNITY): Payer: Self-pay | Admitting: Emergency Medicine

## 2023-04-20 DIAGNOSIS — R001 Bradycardia, unspecified: Secondary | ICD-10-CM

## 2023-04-20 DIAGNOSIS — E860 Dehydration: Secondary | ICD-10-CM

## 2023-04-20 DIAGNOSIS — R55 Syncope and collapse: Secondary | ICD-10-CM

## 2023-04-20 MED ORDER — SODIUM CHLORIDE 0.9 % IV BOLUS
1000.0000 mL | Freq: Once | INTRAVENOUS | Status: AC
  Administered 2023-04-21: 1000 mL via INTRAVENOUS

## 2023-04-20 NOTE — ED Provider Triage Note (Signed)
 Emergency Medicine Provider Triage Evaluation Note  [facility] Hoffman , a 23 y.o. female  was evaluated in triage.  Pt complains of near syncope.  Review of Systems  Positive: Positional lightheadedness Negative: Full syncope, chest pain, SOB, bleeding  Physical Exam  BP (!) 81/53   Pulse 83   Temp 99.4 F (37.4 C) (Oral)   Resp 16   Ht 5\' 11"  (1.803 m)   Wt 104.3 kg   LMP 04/06/2023   SpO2 100%   BMI 32.08 kg/m  Gen:   Awake, no distress   Resp:  Normal effort  MSK:   Moves extremities without difficulty  Other:    Medical Decision Making  Medically screening exam initiated at 11:56 PM.  Appropriate orders placed.  Br[facility] Hoffman was informed that the remainder of the evaluation will be completed by another provider, this initial triage assessment does not replace that evaluation, and the importance of remaining in the ED until their evaluation is complete.  Patient here with feeling she will pass out when she stands up. No full syncope. Normal period 2 weeks ago, not heavy. No chest pain, SOB. Had similar symptoms in the past and evaluated by cardiology - "they did a lot of tests but did not find a reason".    [place] Second, PA-C 04/20/23 2358

## 2023-04-20 NOTE — ED Triage Notes (Signed)
 Pt reports feeling faint with standing x 3 days, denies any other symptoms, pt hypotensive in triage 81/53

## 2023-04-21 LAB — COMPREHENSIVE METABOLIC PANEL WITH GFR
ALT: 13 U/L (ref 0–44)
AST: 12 U/L — ABNORMAL LOW (ref 15–41)
Albumin: 3.3 g/dL — ABNORMAL LOW (ref 3.5–5.0)
Alkaline Phosphatase: 47 U/L (ref 38–126)
Anion gap: 6 (ref 5–15)
BUN: 6 mg/dL (ref 6–20)
CO2: 25 mmol/L (ref 22–32)
Calcium: 8.4 mg/dL — ABNORMAL LOW (ref 8.9–10.3)
Chloride: 106 mmol/L (ref 98–111)
Creatinine, Ser: 0.99 mg/dL (ref 0.44–1.00)
GFR, Estimated: >60 mL/min (ref >60)
Glucose, Bld: 75 mg/dL (ref 70–99)
Potassium: 3.3 mmol/L — ABNORMAL LOW (ref 3.5–5.1)
Sodium: 137 mmol/L (ref 135–145)
Total Bilirubin: 0.3 mg/dL (ref 0.0–1.2)
Total Protein: 6.6 g/dL (ref 6.5–8.1)

## 2023-04-21 LAB — CBC WITH DIFFERENTIAL/PLATELET
Abs Immature Granulocytes: 0.02 10*3/uL (ref 0.00–0.07)
Basophils Absolute: 0.1 10*3/uL (ref 0.0–0.1)
Basophils Relative: 1 %
Eosinophils Absolute: 0.4 10*3/uL (ref 0.0–0.5)
Eosinophils Relative: 4 %
HCT: 42.1 % (ref 36.0–46.0)
Hemoglobin: 13.6 g/dL (ref 12.0–15.0)
Immature Granulocytes: 0 %
Lymphocytes Relative: 33 %
Lymphs Abs: 2.8 10*3/uL (ref 0.7–4.0)
MCH: 30.2 pg (ref 26.0–34.0)
MCHC: 32.3 g/dL (ref 30.0–36.0)
MCV: 93.3 fL (ref 80.0–100.0)
Monocytes Absolute: 0.6 10*3/uL (ref 0.1–1.0)
Monocytes Relative: 7 %
Neutro Abs: 4.6 10*3/uL (ref 1.7–7.7)
Neutrophils Relative %: 55 %
Platelets: 291 10*3/uL (ref 150–400)
RBC: 4.51 MIL/uL (ref 3.87–5.11)
RDW: 13 % (ref 11.5–15.5)
WBC: 8.4 10*3/uL (ref 4.0–10.5)
nRBC: 0 % (ref 0.0–0.2)

## 2023-04-21 LAB — URINALYSIS, ROUTINE W REFLEX MICROSCOPIC
Bilirubin Urine: NEGATIVE
Glucose, UA: NEGATIVE mg/dL
Hgb urine dipstick: NEGATIVE
Ketones, ur: NEGATIVE mg/dL
Leukocytes,Ua: NEGATIVE
Nitrite: NEGATIVE
Protein, ur: NEGATIVE mg/dL
Specific Gravity, Urine: 1.003 — ABNORMAL LOW (ref 1.005–1.030)
pH: 7 (ref 5.0–8.0)

## 2023-04-21 LAB — RAPID URINE DRUG SCREEN, HOSP PERFORMED
Amphetamines: NOT DETECTED
Barbiturates: NOT DETECTED
Benzodiazepines: NOT DETECTED
Cocaine: NOT DETECTED
Opiates: NOT DETECTED
Tetrahydrocannabinol: NOT DETECTED

## 2023-04-21 LAB — HCG, SERUM, QUALITATIVE: Preg, Serum: NEGATIVE

## 2023-04-21 NOTE — ED Provider Notes (Signed)
[place] EMERGENCY DEPARTMENT AT [facility] Provider Note   CSN: [id number] Arrival date & time: 04/20/23  2323     History  Chief Complaint  Patient presents with   Near Syncope    Brittany Hoffman is a 23 y.o. female.  Patient to ED with progressively worsening lightheadedness with standing. She feels like she's going to pass out but denies full syncope. No chest pain, SoB, nausea. LMP was 2 weeks ago, was normal without heavy bleeding. No recent illness, fever, vomiting or diarrhea. She reports having similar symptoms about a year ago and was followed up by cardiology without finding of the cause. No new medications.   The history is provided by the patient. No language interpreter was used.  Near Syncope       Home Medications Prior to Admission medications   Medication Sig Start Date End Date Taking? Authorizing Provider  famotidine (PEPCID) 20 MG tablet Take 1 tablet (20 mg total) by mouth daily for 15 days. 09/14/22 09/29/22  Audria Leather, MD  loperamide (IMODIUM) 2 MG capsule Take 1 capsule (2 mg total) by mouth 4 (four) times daily as needed for diarrhea or loose stools. Patient not taking: Reported on 09/11/2022 09/08/22   Kingsley, Victoria K, DO  ondansetron (ZOFRAN-ODT) 4 MG disintegrating tablet Take 1 tablet (4 mg total) by mouth every 8 (eight) hours as needed for nausea or vomiting. 09/28/22   Sponseller, Anola Basques R, PA-C  potassium chloride SA (KLOR-CON M) 20 MEQ tablet Take 1 tablet (20 mEq total) by mouth 2 (two) times daily. 09/28/22   Sponseller, Rebekah R, PA-C      Allergies    Patient has no known allergies.    Review of Systems   Review of Systems  Cardiovascular:  Positive for near-syncope.    Physical Exam Updated Vital Signs BP 128/88 (BP Location: Right Arm)   Pulse (!) 45   Temp 99.4 F (37.4 C) (Oral)   Resp 11   Ht 5\' 11"  (1.803 m)   Wt 104.3 kg   LMP 04/06/2023   SpO2 100%   BMI 32.08 kg/m  Physical Exam Vitals and nursing  note reviewed.  Constitutional:      Appearance: She is well-developed.  HENT:     Head: Normocephalic.  Eyes:     Comments: No conjunctival pallor  Cardiovascular:     Rate and Rhythm: Regular rhythm. Bradycardia present.     Heart sounds: No murmur heard. Pulmonary:     Effort: Pulmonary effort is normal.     Breath sounds: Normal breath sounds. No wheezing, rhonchi or rales.  Abdominal:     General: Bowel sounds are normal.     Palpations: Abdomen is soft.     Tenderness: There is no abdominal tenderness. There is no guarding or rebound.  Musculoskeletal:        General: No tenderness. Normal range of motion.     Cervical back: Normal range of motion and neck supple.     Right lower leg: No edema.     Left lower leg: No edema.  Skin:    General: Skin is warm and dry.  Neurological:     General: No focal deficit present.     Mental Status: She is alert and oriented to person, place, and time.     ED Results / Procedures / Treatments   Labs (all labs ordered are listed, but only abnormal results are displayed) Labs Reviewed  COMPREHENSIVE METABOLIC PANEL WITH GFR -  Abnormal; Notable for the following components:      Result Value   Potassium 3.3 (*)    Calcium 8.4 (*)    Albumin 3.3 (*)    AST 12 (*)    All other components within normal limits  URINALYSIS, ROUTINE W REFLEX MICROSCOPIC - Abnormal; Notable for the following components:   Color, Urine STRAW (*)    Specific Gravity, Urine 1.003 (*)    All other components within normal limits  CBC WITH DIFFERENTIAL/PLATELET  HCG, SERUM, QUALITATIVE  RAPID URINE DRUG SCREEN, HOSP PERFORMED   Results for orders placed or performed during the hospital encounter of 04/20/23  CBC with Differential   Collection Time: 04/21/23 12:14 AM  Result Value Ref Range   WBC 8.4 4.0 - 10.5 K/uL   RBC 4.51 3.87 - 5.11 MIL/uL   Hemoglobin 13.6 12.0 - 15.0 g/dL   HCT 40.9 81.1 - 91.4 %   MCV 93.3 80.0 - 100.0 fL   MCH 30.2 26.0 -  34.0 pg   MCHC 32.3 30.0 - 36.0 g/dL   RDW 78.2 95.6 - 21.3 %   Platelets 291 150 - 400 K/uL   nRBC 0.0 0.0 - 0.2 %   Neutrophils Relative % 55 %   Neutro Abs 4.6 1.7 - 7.7 K/uL   Lymphocytes Relative 33 %   Lymphs Abs 2.8 0.7 - 4.0 K/uL   Monocytes Relative 7 %   Monocytes Absolute 0.6 0.1 - 1.0 K/uL   Eosinophils Relative 4 %   Eosinophils Absolute 0.4 0.0 - 0.5 K/uL   Basophils Relative 1 %   Basophils Absolute 0.1 0.0 - 0.1 K/uL   Immature Granulocytes 0 %   Abs Immature Granulocytes 0.02 0.00 - 0.07 K/uL  Comprehensive metabolic panel   Collection Time: 04/21/23 12:14 AM  Result Value Ref Range   Sodium 137 135 - 145 mmol/L   Potassium 3.3 (L) 3.5 - 5.1 mmol/L   Chloride 106 98 - 111 mmol/L   CO2 25 22 - 32 mmol/L   Glucose, Bld 75 70 - 99 mg/dL   BUN 6 6 - 20 mg/dL   Creatinine, Ser 0.86 0.44 - 1.00 mg/dL   Calcium 8.4 (L) 8.9 - 10.3 mg/dL   Total Protein 6.6 6.5 - 8.1 g/dL   Albumin 3.3 (L) 3.5 - 5.0 g/dL   AST 12 (L) 15 - 41 U/L   ALT 13 0 - 44 U/L   Alkaline Phosphatase 47 38 - 126 U/L   Total Bilirubin 0.3 0.0 - 1.2 mg/dL   GFR, Estimated >57 >84 mL/min   Anion gap 6 5 - 15  Urinalysis, Routine w reflex microscopic -Urine, Clean Catch   Collection Time: 04/21/23  2:13 AM  Result Value Ref Range   Color, Urine STRAW (A) YELLOW   APPearance CLEAR CLEAR   Specific Gravity, Urine 1.003 (L) 1.005 - 1.030   pH 7.0 5.0 - 8.0   Glucose, UA NEGATIVE NEGATIVE mg/dL   Hgb urine dipstick NEGATIVE NEGATIVE   Bilirubin Urine NEGATIVE NEGATIVE   Ketones, ur NEGATIVE NEGATIVE mg/dL   Protein, ur NEGATIVE NEGATIVE mg/dL   Nitrite NEGATIVE NEGATIVE   Leukocytes,Ua NEGATIVE NEGATIVE  hCG, serum, qualitative   Collection Time: 04/21/23  2:14 AM  Result Value Ref Range   Preg, Serum NEGATIVE NEGATIVE  Urine rapid drug screen (hosp performed)   Collection Time: 04/21/23  2:14 AM  Result Value Ref Range   Opiates NONE DETECTED NONE DETECTED   Cocaine NONE  DETECTED NONE  DETECTED   Benzodiazepines NONE DETECTED NONE DETECTED   Amphetamines NONE DETECTED NONE DETECTED   Tetrahydrocannabinol NONE DETECTED NONE DETECTED   Barbiturates NONE DETECTED NONE DETECTED    EKG EKG Interpretation Date/Time:  Monday April 20 2023 23:57:12 EDT Ventricular Rate:  85 PR Interval:  140 QRS Duration:  81 QT Interval:  392 QTC Calculation: 467 R Axis:   80  Text Interpretation: Sinus rhythm Confirmed by Kelsey Patricia [phone]) on 04/21/2023 1:49:43 AM  Radiology No results found.  Procedures Procedures    Medications Ordered in ED Medications  sodium chloride 0.9 % bolus 1,000 mL (0 mLs Intravenous Stopped 04/21/23 0103)    ED Course/ Medical Decision Making/ A&P                                 Medical Decision Making This patient presents to the ED for concern of near syncope, this involves an extensive number of treatment options, and is a complaint that carries with it a high risk of complications and morbidity.  The differential diagnosis includes arrhythmia, anemia, sepsis/infection   Co morbidities that complicate the patient evaluation  History of similar symptoms   Additional history obtained:  Additional history and/or information obtained from chart review, notable for  Previous admission 09/2022 for multiple episodes of syncope, bradycardia - admitted overnight with cardiology consult Cardiac monitoring in outpatient cardiology follow up 09/2022 - normal [facility])   Lab Tests:  I Ordered, and personally interpreted labs.  The pertinent results include:  UDS neg; pregnancy neg; UA negative; WBC 8.4, normal hgb; K+ 3.3, no other electrolyte abnormalities.     Imaging Studies ordered:  I ordered imaging studies including n/a I independently visualized and interpreted imaging which showed n/a I agree with the radiologist interpretation   Cardiac Monitoring:  The patient was maintained on a cardiac monitor.  I personally viewed and  interpreted the cardiac monitored which showed an underlying rhythm of: Sinus bradycardia    Test Considered:  N/a   Critical Interventions:  N/a   Consultations Obtained:  I requested consultation with the n/a,  and discussed lab and imaging findings as well as pertinent plan - they recommend: n/a   Problem List / ED Course:  Here with 3 days of feeling like she is going to pass out when she stands up No full syncope, no pain Labs, EKG significant only for bradycardia, rate 47 On re-evaluation after IV fluids, blood pressure is significantly better She is ambulated without symptoms of lightheadedness, feels back to baseline and is asymptomatic.  On standing her heart rate is 73 She is considered stable for discharge home, cardiology follow up (no PCP)    Reevaluation:  After the interventions noted above, I reevaluated the patient and found that they have :resolved   Social Determinants of Health:  Smoker, no PCP   Disposition:  After consideration of the diagnostic results and the patients response to treatment, I feel that the patient would benefit from discharge home, referral to cardiology.   Amount and/or Complexity of Data Reviewed Labs: ordered.           Final Clinical Impression(s) / ED Diagnoses Final diagnoses:  Near syncope  Dehydration  Bradycardia    Rx / DC Orders ED Discharge Orders     None         Rama Burkitt 04/21/23 0309    Scarlette Currier, MD  04/21/23 1341  

## 2023-04-21 NOTE — Discharge Instructions (Signed)
 Your labs and heart tracings tonight show only a slow heart rate while at rest (normal when standing), similar to when you were admitted last September. Your low blood pressure resolved with IV fluids. It is important to stay hydrated and drink plenty of water.   Follow up with Dr. Renna Cary with cardiology for any further outpatient tests felt indicated. REturn to the ED with any new or concerning symptoms at any time.

## 2023-07-27 ENCOUNTER — Emergency Department (HOSPITAL_COMMUNITY)
Admission: EM | Admit: 2023-07-27 | Discharge: 2023-07-27 | Disposition: A | Discharge status: Home / Self Care | Payer: Self-pay | Attending: Emergency Medicine | Admitting: Emergency Medicine

## 2023-07-27 ENCOUNTER — Encounter (HOSPITAL_COMMUNITY): Payer: Self-pay

## 2023-07-27 ENCOUNTER — Other Ambulatory Visit: Payer: Self-pay

## 2023-07-27 DIAGNOSIS — Z0189 Encounter for other specified special examinations: Secondary | ICD-10-CM | POA: Insufficient documentation

## 2023-07-27 LAB — RAPID HIV SCREEN (HIV 1/2 AB+AG)
HIV 1/2 Antibodies: NONREACTIVE
HIV-1 P24 Antigen - HIV24: NONREACTIVE

## 2023-07-27 LAB — HEPATITIS PANEL, ACUTE
HCV Ab: NONREACTIVE
Hep A IgM: NONREACTIVE
Hep B C IgM: NONREACTIVE
Hepatitis B Surface Ag: NONREACTIVE

## 2023-07-27 NOTE — ED Triage Notes (Signed)
 Pt to ED requesting lab testing, reports went to go donate plasma and was told Hep B was positive.

## 2023-07-27 NOTE — Discharge Instructions (Addendum)
 Your laboratory results are within normal limits today.  You were given a copy for your records.

## 2023-07-27 NOTE — ED Provider Notes (Signed)
 [place] EMERGENCY DEPARTMENT AT [facility] Provider Note   CSN: [id number] Arrival date & time: 07/27/23  1653     Patient presents with: Abnormal Lab   Brittany Hoffman is a 23 y.o. female.   23 year old female with no past medical history presents to the ED with a chief complaint of requesting lab work.  Patient reports she went to donate plasma today, was found to be hepatitis B positive.  She reports no prior history of IV drug use, no blood transfusions.  No other complaints reported.  The history is provided by the patient.  Abnormal Lab      Prior to Admission medications   Medication Sig Start Date End Date Taking? Authorizing Provider  famotidine  (PEPCID ) 20 MG tablet Take 1 tablet (20 mg total) by mouth daily for 15 days. 09/14/22 09/29/22  Cheryle Page, MD  loperamide  (IMODIUM ) 2 MG capsule Take 1 capsule (2 mg total) by mouth 4 (four) times daily as needed for diarrhea or loose stools. Patient not taking: Reported on 09/11/2022 09/08/22   Kingsley, Victoria K, DO  ondansetron  (ZOFRAN -ODT) 4 MG disintegrating tablet Take 1 tablet (4 mg total) by mouth every 8 (eight) hours as needed for nausea or vomiting. 09/28/22   Sponseller, Pleasant R, PA-C  potassium chloride  SA (KLOR-CON  M) 20 MEQ tablet Take 1 tablet (20 mEq total) by mouth 2 (two) times daily. 09/28/22   Sponseller, Rebekah R, PA-C    Allergies: Patient has no known allergies.    Review of Systems  Constitutional:  Negative for fever.    Updated Vital Signs BP 122/74 (BP Location: Right Arm)   Pulse 91   Temp 98.5 F (36.9 C)   Resp 17   Ht 5' 11 (1.803 m)   Wt 81.6 kg   SpO2 99%   BMI 25.10 kg/m   Physical Exam Vitals and nursing note reviewed.  Constitutional:      Appearance: Normal appearance.  HENT:     Head: Normocephalic and atraumatic.     Mouth/Throat:     Mouth: Mucous membranes are moist.  Cardiovascular:     Rate and Rhythm: Normal rate.  Pulmonary:     Effort: Pulmonary  effort is normal.  Abdominal:     General: Abdomen is flat.  Musculoskeletal:     Cervical back: Normal range of motion and neck supple.  Skin:    General: Skin is warm and dry.  Neurological:     Mental Status: She is alert and oriented to person, place, and time.     (all labs ordered are listed, but only abnormal results are displayed) Labs Reviewed  HEPATITIS PANEL, ACUTE  RAPID HIV SCREEN (HIV 1/2 AB+AG)    EKG: None  Radiology: No results found.   Procedures   Medications Ordered in the ED - No data to display                                  Medical Decision Making Amount and/or Complexity of Data Reviewed Labs: ordered.    Patient presents to the ED requesting blood work, reports she attempted to donate plasma today, was told that her hepatitis B came back positive.  She reports no prior history of IV drug use, no prior blood transfusions, has never been exposed that she is aware of.  Her vitals are within normal limits.  Testing obtained today such as hepatitis panel along with  HIV testing were both negative, provided with results.  Hemodynamically stable for discharge.  Portions of this note were generated with Scientist, clinical (histocompatibility and immunogenetics). Dictation errors may occur despite best attempts at proofreading.     Final diagnoses:  Encounter for routine laboratory testing    ED Discharge Orders     None          Maureen Broad, DEVONNA 07/27/23 2057    Freddi Hamilton, MD 07/29/23 [phone]

## 2023-12-13 ENCOUNTER — Other Ambulatory Visit: Payer: Self-pay

## 2023-12-13 ENCOUNTER — Encounter (HOSPITAL_COMMUNITY): Payer: Self-pay

## 2023-12-13 ENCOUNTER — Inpatient Hospital Stay (HOSPITAL_COMMUNITY)
Admission: EM | Admit: 2023-12-13 | Discharge: 2023-12-14 | DRG: 310 | Disposition: A | Discharge status: Home / Self Care | Payer: MEDICAID | Attending: Internal Medicine | Admitting: Internal Medicine

## 2023-12-13 DIAGNOSIS — R001 Bradycardia, unspecified: Principal | ICD-10-CM | POA: Diagnosis present

## 2023-12-13 DIAGNOSIS — Z720 Tobacco use: Secondary | ICD-10-CM

## 2023-12-13 LAB — COMPREHENSIVE METABOLIC PANEL WITH GFR
ALT: 15 U/L (ref 0–44)
AST: 13 U/L — ABNORMAL LOW (ref 15–41)
Albumin: 4 g/dL (ref 3.5–5.0)
Alkaline Phosphatase: 64 U/L (ref 38–126)
Anion gap: 9 (ref 5–15)
BUN: 7 mg/dL (ref 6–20)
CO2: 25 mmol/L (ref 22–32)
Calcium: 9 mg/dL (ref 8.9–10.3)
Chloride: 101 mmol/L (ref 98–111)
Creatinine, Ser: 0.74 mg/dL (ref 0.44–1.00)
GFR, Estimated: >60 mL/min (ref >60)
Glucose, Bld: 132 mg/dL — ABNORMAL HIGH (ref 70–99)
Potassium: 4.2 mmol/L (ref 3.5–5.1)
Sodium: 135 mmol/L (ref 135–145)
Total Bilirubin: 0.2 mg/dL (ref 0.0–1.2)
Total Protein: 7.2 g/dL (ref 6.5–8.1)

## 2023-12-13 LAB — URINALYSIS, ROUTINE W REFLEX MICROSCOPIC
Bilirubin Urine: NEGATIVE
Glucose, UA: NEGATIVE mg/dL
Hgb urine dipstick: NEGATIVE
Ketones, ur: NEGATIVE mg/dL
Leukocytes,Ua: NEGATIVE
Nitrite: NEGATIVE
Protein, ur: NEGATIVE mg/dL
Specific Gravity, Urine: 1.006 (ref 1.005–1.030)
pH: 7 (ref 5.0–8.0)

## 2023-12-13 LAB — CBC
HCT: 44.5 % (ref 36.0–46.0)
Hemoglobin: 15.1 g/dL — ABNORMAL HIGH (ref 12.0–15.0)
MCH: 30.4 pg (ref 26.0–34.0)
MCHC: 33.9 g/dL (ref 30.0–36.0)
MCV: 89.7 fL (ref 80.0–100.0)
Platelets: 276 K/uL (ref 150–400)
RBC: 4.96 MIL/uL (ref 3.87–5.11)
RDW: 13.1 % (ref 11.5–15.5)
WBC: 7.7 K/uL (ref 4.0–10.5)
nRBC: 0 % (ref 0.0–0.2)

## 2023-12-13 LAB — RESP PANEL BY RT-PCR (RSV, FLU A&B, COVID)  RVPGX2
Influenza A by PCR: NEGATIVE
Influenza B by PCR: NEGATIVE
Resp Syncytial Virus by PCR: NEGATIVE
SARS Coronavirus 2 by RT PCR: NEGATIVE

## 2023-12-13 LAB — MAGNESIUM: Magnesium: 1.9 mg/dL (ref 1.7–2.4)

## 2023-12-13 LAB — HCG, SERUM, QUALITATIVE: Preg, Serum: NEGATIVE

## 2023-12-13 MED ORDER — SODIUM CHLORIDE 0.9% FLUSH
3.0000 mL | Freq: Two times a day (BID) | INTRAVENOUS | Status: DC
  Administered 2023-12-13: 3 mL via INTRAVENOUS

## 2023-12-13 MED ORDER — ONDANSETRON HCL 4 MG PO TABS: 4.0000 mg | ORAL_TABLET | Freq: Four times a day (QID) | ORAL | Status: DC | PRN

## 2023-12-13 MED ORDER — ONDANSETRON HCL 4 MG/2ML IJ SOLN: 4.0000 mg | Freq: Four times a day (QID) | INTRAMUSCULAR | Status: DC | PRN

## 2023-12-13 MED ORDER — LACTATED RINGERS IV BOLUS
1000.0000 mL | Freq: Once | INTRAVENOUS | Status: AC
  Administered 2023-12-13: 1000 mL via INTRAVENOUS

## 2023-12-13 MED ORDER — LACTATED RINGERS IV BOLUS
500.0000 mL | Freq: Once | INTRAVENOUS | Status: AC
  Administered 2023-12-13: 500 mL via INTRAVENOUS

## 2023-12-13 NOTE — ED Notes (Signed)
 Pt ambulated to and from restroom without assistance.  Once back in room, pt reconnected to continuous cardiac, pulse ox and bp monitoring.  A few moments later, this RN notified that patient current HR is 27-31.  Attempting to reach hospitalist, but no MD currently listed on patient's chart.  Awaiting information from [facility]

## 2023-12-13 NOTE — ED Notes (Signed)
 First poc with patient.  PT asleep, but easily awakes. Significant other at bedside.  Pt denies complaints.  Pt remains on continuous cardiac, pulse ox and bp monitoring.  Pt provided warm blankets per request.  No respiratory distress noted at this time.  Pt updated on plan of care and pending disposition

## 2023-12-13 NOTE — ED Notes (Signed)
 Received callback from Dr. Tobie, made aware to just monitor patient d/t continuing to be asymptomatic.  States he will reach out to cardiology to keep a close watch on patient

## 2023-12-13 NOTE — H&P (Addendum)
 History and Physical    Patient: Brittany Hoffman FMW:[id number] DOB: 2000-06-11 DOA: 12/13/2023 DOS: the patient was seen and examined on 12/13/2023 PCP: Pcp, No  Patient coming from: Home  Chief Complaint: No chief complaint on file.  HPI: Brittany Hoffman is a 23 y.o. female with medical history significant for bradycardia, tobacco abuse and hypertension.  The patient was hospitalized a little over a year ago with symptomatic bradycardia.  Her heart rate was in the 30s on admission but by discharge it was in the 40s to 50s.  The patient was seen by cardiology inpatient and an outpatient cardiology appointment was supposed to be arranged.  This was not done.  The patient says she tried to make an appointment and they told her it would be 2 years before she could be seen.  The patient presented at that time after having abdominal pain, nausea, vomiting and some diarrhea.  It was felt that perhaps the worsened bradycardia may have been because of decreased vagal tone with those symptoms.  Tonight she presents to the emergency department because of 3 days of nausea, vomiting, and diarrhea.  She started to feel very weak and was lightheaded with standing.  Yesterday she really could not stand up at all she stayed in bed all day.  She drank a Gatorade but did not feel any better so she decided to come in today because she said she could not move without assistance.  The patient watched her young niece and nephew last week and they had diarrhea and were very sick.  She suspects she got this from them.  Her boyfriend also has very similar symptoms he was feeling pretty awful as well and is checked in the ED to be seen at this same time.  In the emergency department the patient received 1 L of LR and said she feels significantly better.  Her diarrhea stopped yesterday but she still was so weak she could not get out of bed. Her boyfriend was discharged and she expected to be discharged as well.  After explaining the  situation she is agreeable to stay to be seen by the cardiologist.  Prior to these recent symptoms the patient says she does not have trouble with exertional chest pain or shortness of breath.  She says she may get a little lightheaded if she pushes herself physically. Her evaluation in the ED included lab results that were fairly unremarkable.  She was COVID, flu, and RSV negative.  The ED provider did call cardiology on-call.  Because her heart rate remained in the 30s for the hours that she was monitored in the ED cardiology has recommended that she be transferred to [facility] so she can be seen tonight.    Review of Systems: As mentioned in the history of present illness. All other systems reviewed and are negative. Past Medical History:  Diagnosis Date   Known health problems: none    Past Surgical History:  Procedure Laterality Date   NO PAST SURGERIES     Social History:  reports that she has been smoking cigars. She has never used smokeless tobacco. She reports current alcohol use. She reports that she does not currently use drugs.  No Known Allergies  Family History  Problem Relation Age of Onset   Hypertension Mother     Prior to Admission medications   Not on File    Physical Exam: Vitals:   12/13/23 1521  BP: (!) 128/93  Pulse: (!) 37  Resp: 16  Temp:  98.1 F (36.7 C)  TempSrc: Oral  SpO2: 100%   Physical Exam:  General: ill appearing, well developed, well nourished HEENT: Normocephalic, atraumatic, PERRL Cardiovascular: Normal rate and rhythm. Distal pulses intact. Pulmonary: Normal pulmonary effort, normal breath sounds Gastrointestinal: Nondistended abdomen, soft, mild tenderness left upper quad, normoactive bowel sounds Musculoskeletal:Normal ROM, no lower ext edema Lymphadenopathy: No cervical LAD. Skin: Skin is warm and dry. Neuro: No focal deficits noted, AAOx3. PSYCH: Attentive and cooperative  Data Reviewed:  Results for orders placed or performed  during the hospital encounter of 12/13/23 (from the past 24 hours)  Resp panel by RT-PCR (RSV, Flu A&B, Covid) Anterior Nasal Swab     Status: None   Collection Time: 12/13/23  3:35 PM   Specimen: Anterior Nasal Swab  Result Value Ref Range   SARS Coronavirus 2 by RT PCR NEGATIVE NEGATIVE   Influenza A by PCR NEGATIVE NEGATIVE   Influenza B by PCR NEGATIVE NEGATIVE   Resp Syncytial Virus by PCR NEGATIVE NEGATIVE  Comprehensive metabolic panel     Status: Abnormal   Collection Time: 12/13/23  4:31 PM  Result Value Ref Range   Sodium 135 135 - 145 mmol/L   Potassium 4.2 3.5 - 5.1 mmol/L   Chloride 101 98 - 111 mmol/L   CO2 25 22 - 32 mmol/L   Glucose, Bld 132 (H) 70 - 99 mg/dL   BUN 7 6 - 20 mg/dL   Creatinine, Ser 9.25 0.44 - 1.00 mg/dL   Calcium 9.0 8.9 - 89.6 mg/dL   Total Protein 7.2 6.5 - 8.1 g/dL   Albumin 4.0 3.5 - 5.0 g/dL   AST 13 (L) 15 - 41 U/L   ALT 15 0 - 44 U/L   Alkaline Phosphatase 64 38 - 126 U/L   Total Bilirubin 0.2 0.0 - 1.2 mg/dL   GFR, Estimated >39 >39 mL/min   Anion gap 9 5 - 15  CBC     Status: Abnormal   Collection Time: 12/13/23  4:31 PM  Result Value Ref Range   WBC 7.7 4.0 - 10.5 K/uL   RBC 4.96 3.87 - 5.11 MIL/uL   Hemoglobin 15.1 (H) 12.0 - 15.0 g/dL   HCT 55.4 63.9 - 53.9 %   MCV 89.7 80.0 - 100.0 fL   MCH 30.4 26.0 - 34.0 pg   MCHC 33.9 30.0 - 36.0 g/dL   RDW 86.8 88.4 - 84.4 %   Platelets 276 150 - 400 K/uL   nRBC 0.0 0.0 - 0.2 %  hCG, serum, qualitative     Status: None   Collection Time: 12/13/23  4:31 PM  Result Value Ref Range   Preg, Serum NEGATIVE NEGATIVE  Magnesium      Status: None   Collection Time: 12/13/23  4:32 PM  Result Value Ref Range   Magnesium  1.9 1.7 - 2.4 mg/dL  Urinalysis, Routine w reflex microscopic -Urine, Clean Catch     Status: None   Collection Time: 12/13/23  5:18 PM  Result Value Ref Range   Color, Urine YELLOW YELLOW   APPearance CLEAR CLEAR   Specific Gravity, Urine 1.006 1.005 - 1.030   pH 7.0 5.0 -  8.0   Glucose, UA NEGATIVE NEGATIVE mg/dL   Hgb urine dipstick NEGATIVE NEGATIVE   Bilirubin Urine NEGATIVE NEGATIVE   Ketones, ur NEGATIVE NEGATIVE mg/dL   Protein, ur NEGATIVE NEGATIVE mg/dL   Nitrite NEGATIVE NEGATIVE   Leukocytes,Ua NEGATIVE NEGATIVE     Assessment and Plan: Bradycardia, chronic -Will  transfer to [facility] per cardiology recommendation so she can be evaluated this evening. Dehydration due to diarrhea vomiting - she feels better after 1 L IV fluids in the ED.  Her vomiting and diarrhea have stopped. Tobacco abuse - the patient plans to quit in 2026 4.  Hypertension - this is listed in her medical history but her blood pressure is okay at this time and she is not on any medication for this.   Advance Care Planning:   Code Status: Full Code the patient names her mother is her surrogate decision maker and she wants to be full code  Consults: [facility]  Family Communication: None  Severity of Illness: The appropriate patient status for this patient is INPATIENT. Inpatient status is judged to be reasonable and necessary in order to provide the required intensity of service to ensure the patient's safety. The patient's presenting symptoms, physical exam findings, and initial radiographic and laboratory data in the context of their chronic comorbidities is felt to place them at high risk for further clinical deterioration. Furthermore, it is not anticipated that the patient will be medically stable for discharge from the hospital within 2 midnights of admission.   * I certify that at the point of admission it is my clinical judgment that the patient will require inpatient hospital care spanning beyond 2 midnights from the point of admission due to high intensity of service, high risk for further deterioration and high frequency of surveillance required.*  Author: ARTHEA CHILD, MD 12/13/2023 6:52 PM  For on call review [email or web address].

## 2023-12-13 NOTE — ED Provider Notes (Signed)
 [place] [facility]MENT AT [facility] Provider Note   CSN: [id number] Arrival date & time: 12/13/23  1509     Patient presents with: No chief complaint on file.   Brittany Hoffman is a 23 y.o. female.   HPI   Patient presents ED for evaluation of nausea vomiting, diarrhea and weakness.  Patient states that she had a couple episodes of diarrhea yesterday.  She has also had an episode nausea vomiting today before.  Patient denies any vomiting or diarrhea today.  Patient states she is been feeling generally weak.  She has had some coughing as well.  No known fevers.  Patient states she does have a history of bradycardia.  Patient was admitted to the hospital in September of last year for syncope patient.  Patient was noted to be bradycardic but I thought her symptoms were related to dysautonomia.  Prior to Admission medications   Medication Sig Start Date End Date Taking? Authorizing Provider  famotidine  (PEPCID ) 20 MG tablet Take 1 tablet (20 mg total) by mouth daily for 15 days. 09/14/22 09/29/22  Cheryle Page, MD  loperamide  (IMODIUM ) 2 MG capsule Take 1 capsule (2 mg total) by mouth 4 (four) times daily as needed for diarrhea or loose stools. Patient not taking: Reported on 09/11/2022 09/08/22   Kingsley, Victoria K, DO  ondansetron  (ZOFRAN -ODT) 4 MG disintegrating tablet Take 1 tablet (4 mg total) by mouth every 8 (eight) hours as needed for nausea or vomiting. 09/28/22   Sponseller, Pleasant R, PA-C  potassium chloride  SA (KLOR-CON  M) 20 MEQ tablet Take 1 tablet (20 mEq total) by mouth 2 (two) times daily. 09/28/22   Sponseller, Rebekah R, PA-C    Allergies: Patient has no known allergies.    Review of Systems  Updated Vital Signs BP (!) 128/93 (BP Location: Right Arm)   Pulse (!) 37   Temp 98.1 F (36.7 C) (Oral)   Resp 16   SpO2 100%   Physical Exam Vitals and nursing note reviewed.  Constitutional:      General: She is not in acute distress.    Appearance: She  is well-developed.  HENT:     Head: Normocephalic and atraumatic.     Right Ear: External ear normal.     Left Ear: External ear normal.  Eyes:     General: No scleral icterus.       Right eye: No discharge.        Left eye: No discharge.     Conjunctiva/sclera: Conjunctivae normal.  Neck:     Trachea: No tracheal deviation.  Cardiovascular:     Rate and Rhythm: Regular rhythm. Bradycardia present.  Pulmonary:     Effort: Pulmonary effort is normal. No respiratory distress.     Breath sounds: Normal breath sounds. No stridor. No wheezing or rales.  Abdominal:     General: Bowel sounds are normal. There is no distension.     Palpations: Abdomen is soft.     Tenderness: There is no abdominal tenderness. There is no guarding or rebound.  Musculoskeletal:        General: No tenderness or deformity.     Cervical back: Neck supple.  Skin:    General: Skin is warm and dry.     Findings: No rash.  Neurological:     General: No focal deficit present.     Mental Status: She is alert.     Cranial Nerves: No cranial nerve deficit, dysarthria or facial asymmetry.  Sensory: No sensory deficit.     Motor: No abnormal muscle tone or seizure activity.     Coordination: Coordination normal.  Psychiatric:        Mood and Affect: Mood normal.     (all labs ordered are listed, but only abnormal results are displayed) Labs Reviewed  COMPREHENSIVE METABOLIC PANEL WITH GFR - Abnormal; Notable for the following components:      Result Value   Glucose, Bld 132 (*)    AST 13 (*)    All other components within normal limits  CBC - Abnormal; Notable for the following components:   Hemoglobin 15.1 (*)    All other components within normal limits  RESP PANEL BY RT-PCR (RSV, FLU A&B, COVID)  RVPGX2  URINALYSIS, ROUTINE W REFLEX MICROSCOPIC  HCG, SERUM, QUALITATIVE  MAGNESIUM     EKG: EKG Interpretation Date/Time:  Sunday December 13 2023 15:33:37 EST Ventricular Rate:  42 PR  Interval:  157 QRS Duration:  87 QT Interval:  502 QTC Calculation: 420 R Axis:   62  Text Interpretation: Sinus bradycardia bradycardia noted on prior ECG Confirmed by Randol Simmonds [phone]) on 12/13/2023 3:37:36 PM  Radiology: No results found.   Procedures   Medications Ordered in the ED  lactated ringers  bolus 1,000 mL (1,000 mLs Intravenous New Bag/Given 12/13/23 1633)    Clinical Course as of 12/13/23 2200  Sun Dec 13, 2023  1728 Magnesium  Magnesium  normal.  CBC and metabolic panel are normal.  COVID flu RSV negative [JK]  1748 Case discussed with Cardiology, Dr Debarah.  [facility]  .  He will see pt when she arrives there [JK]  1822 Case discussed with Dr Arthea [JK]    Clinical Course User Index [JK] Randol Simmonds, MD                                 Medical Decision Making Problems Addressed: Bradycardia: acute illness or injury that poses a threat to life or bodily functions  Amount and/or Complexity of Data Reviewed Labs: ordered. Decision-making details documented in ED Course.  Risk Decision regarding hospitalization.   Patient presented to the ED for evaluation of feeling weak.  Patient also mentions having few episodes of vomiting and diarrhea the other day but she has not had any in the last 24 hours.  She has had some URI symptoms but is not feeling short of breath.  Patient denies any drugs or alcohol use.  Patient's ED workup is notable for persistent bradycardia.  She has heart heart rate since of the mid 30s on the bedside monitor.  Is a sinus bradycardia.  Significant electrolyte abnormalities noted.  No signs of severe dehydration.  Prior records reviewed.  Patient did have a similar episode in the past.  She was post to have outpatient follow-up with cardiology but did not follow-up.   Patient monitored for several hours and she remained bradycardic.  Discussed case with Dr. Debarah cardiology.  Recommends admission to the hospital for further  monitoring.  I will consult with the hospitalist service.  Patient to be admitted to [facility] so cardiology can evaluate her there    Final diagnoses:  Bradycardia    ED Discharge Orders     None          Randol Simmonds, MD 12/13/23 2200

## 2023-12-13 NOTE — ED Triage Notes (Signed)
 Pt came in for vomiting and diarrhea since Thursday. Pt wants to make sure she does not have norovirus. Pt has also been coughing and sneezing. Pt is bradycardic in triage but has a history of it.

## 2023-12-14 ENCOUNTER — Inpatient Hospital Stay: Payer: MEDICAID

## 2023-12-14 ENCOUNTER — Other Ambulatory Visit: Payer: Self-pay | Admitting: Student

## 2023-12-14 DIAGNOSIS — R55 Syncope and collapse: Secondary | ICD-10-CM

## 2023-12-14 DIAGNOSIS — R001 Bradycardia, unspecified: Principal | ICD-10-CM

## 2023-12-14 LAB — RAPID URINE DRUG SCREEN, HOSP PERFORMED
Amphetamines: NOT DETECTED
Barbiturates: NOT DETECTED
Benzodiazepines: NOT DETECTED
Cocaine: NOT DETECTED
Opiates: POSITIVE — AB
Tetrahydrocannabinol: NOT DETECTED

## 2023-12-14 LAB — BASIC METABOLIC PANEL WITH GFR
Anion gap: 8 (ref 5–15)
BUN: 7 mg/dL (ref 6–20)
CO2: 26 mmol/L (ref 22–32)
Calcium: 9.3 mg/dL (ref 8.9–10.3)
Chloride: 102 mmol/L (ref 98–111)
Creatinine, Ser: 0.79 mg/dL (ref 0.44–1.00)
GFR, Estimated: >60 mL/min (ref >60)
Glucose, Bld: 107 mg/dL — ABNORMAL HIGH (ref 70–99)
Potassium: 4.5 mmol/L (ref 3.5–5.1)
Sodium: 137 mmol/L (ref 135–145)

## 2023-12-14 LAB — CBC
HCT: 46 % (ref 36.0–46.0)
Hemoglobin: 15.2 g/dL — ABNORMAL HIGH (ref 12.0–15.0)
MCH: 29.9 pg (ref 26.0–34.0)
MCHC: 33 g/dL (ref 30.0–36.0)
MCV: 90.6 fL (ref 80.0–100.0)
Platelets: 286 K/uL (ref 150–400)
RBC: 5.08 MIL/uL (ref 3.87–5.11)
RDW: 13 % (ref 11.5–15.5)
WBC: 7.3 K/uL (ref 4.0–10.5)
nRBC: 0 % (ref 0.0–0.2)

## 2023-12-14 LAB — T4, FREE: Free T4: 0.7 ng/dL (ref 0.61–1.12)

## 2023-12-14 LAB — MRSA NEXT GEN BY PCR, NASAL: MRSA by PCR Next Gen: DETECTED — AB

## 2023-12-14 LAB — PHOSPHORUS: Phosphorus: 4.1 mg/dL (ref 2.5–4.6)

## 2023-12-14 LAB — TSH: TSH: 0.327 u[IU]/mL — ABNORMAL LOW (ref 0.350–4.500)

## 2023-12-14 MED ORDER — LACTATED RINGERS IV SOLN: INTRAVENOUS | Status: DC

## 2023-12-14 NOTE — Discharge Summary (Signed)
 Physician Discharge Summary   Patient: Brittany Hoffman MRN: [id number] DOB: Nov 16, 2000  Admit date:     12/13/2023  Discharge date: 12/14/23  Discharge Physician: Lonni SHAUNNA Dalton   PCP: Knute Thersia Bitters, FNP     Recommendations at discharge:  Follow up with Cardiology in 1 month for sinus bradycardia Follow up with new PCP Thersia Knute in 2 months  Alexis Caudle: Please repeat TSH, T3, and fT4 at hospital follow up     Discharge Diagnoses: Principal Problem:   Bradycardia Active Problems:   Tobacco abuse      Hospital Course: 23 y.o. F with history sinus bradycardia who presented with 48 hours nausea, vomiting, and diarrhea.    In the ER, electrolytes normal, BP normal.  Noted incidentally to have sinus bradycardia with rates in the 30s and so admitted for Cardiology evaluation.     Sinus bradycardia Admitted overnight and evaluated by cardiology.  Telemetry overnight showed sinus bradycardia ranging from the 30s to the 50s.  Of note, was admitted in context of gastroenteritis-like symptoms in Sep 2024 at which time she had syncope and also found to have marked sinus bradycardia.  Evalauted by cardiology at that time, noted to have intermittent Wenkebach but no high grade heart block, echocardiogram normal.  Was recommended to have a Zio patch and follow up with Cardiology but never followed up.  Again this admission, cardiology suspected this was related to increased vagal tone in setting of GI illness, recommended no treatment at this time.  They arranged a Zio patch and follow up in 4 weeks.    Positive urine drug screen Patient speculated this was from a pain pill she got from her sister last week for a headache. She was counseled about the effect of opiates (illicit or prescribed) on heart rate, including sudden cardiac death.   Abnormal thyroid test Low TSH but normal fT4 makes central hypothyroidism extremely unlikely.  No vision changes or headaches,  no other symptoms to suggest pituitary deficiency.  Suspect euthyroid sick.  I will follow up T3 and I recommend new PCP repeat TSH in 4-6 weeks.  Assessment and Plan: No notes have been filed under this hospital service. Service: Hospitalist           The [place]  Controlled Substances Registry was reviewed for this patient prior to discharge.  Consultants: Cardiology   Disposition: Home Diet recommendation:  Regular diet  DISCHARGE MEDICATION: Allergies as of 12/14/2023   No Known Allergies      Medication List    You have not been prescribed any medications.     Follow-up Information     Dunn, Dayna N, PA-C Follow up.   Specialties: Cardiology, Radiology Contact information: [address] [place] [address] [phone]         Knute Thersia Bitters, FNP Follow up.   Specialty: Family Medicine Why: TIME : 11:10 AM    PLEASE ARRIVE AT 10:30 AM DATE : NOLAN 06 , 2026 FRIDAY  PLEASE BRING ALL CURRENT MEDICATION , ID and INS CARD, CO-PAY Contact information: [address] [place] [address] [phone]                 Discharge Instructions     Discharge instructions   Complete by: As directed    **IMPORTANT DISCHARGE INSTRUCTIONS**   From Dr. Dalton: You were admitted for slow heart rate.  This is likely your normal, probably worse because of the stomach bug you had, plus the  other things we talked about.  Follow up with the Cardiology team  See below, you have an appointment with Dayna Dunn on January 8 in the Cardiology office  You also have an appointment with a new primary care doctor (see below)   Increase activity slowly   Complete by: As directed        Discharge Exam: Filed Weights   12/13/23 2012 12/14/23 0957  Weight: 81.6 kg 86.7 kg    General: Pt is alert, awake, not in acute distress Cardiovascular: Slow regular, nl S1-S2, no murmurs appreciated.   No LE edema.    Respiratory: Normal respiratory rate and rhythm.  CTAB without rales or wheezes. Abdominal: Abdomen soft and non-tender.  No distension or HSM.   Neuro/Psych: Strength symmetric in upper and lower extremities.  Judgment and insight appear normal.   Condition at discharge: good  The results of significant diagnostics from this hospitalization (including imaging, microbiology, ancillary and laboratory) are listed below for reference.   Imaging Studies: No results found.  Microbiology: Results for orders placed or performed during the hospital encounter of 12/13/23  Resp panel by RT-PCR (RSV, Flu A&B, Covid) Anterior Nasal Swab     Status: None   Collection Time: 12/13/23  3:35 PM   Specimen: Anterior Nasal Swab  Result Value Ref Range Status   SARS Coronavirus 2 by RT PCR NEGATIVE NEGATIVE Final    Comment: (NOTE) SARS-CoV-2 target nucleic acids are NOT DETECTED.  The SARS-CoV-2 RNA is generally detectable in upper respiratory specimens during the acute phase of infection. The lowest concentration of SARS-CoV-2 viral copies this assay can detect is 138 copies/mL. A negative result does not preclude SARS-Cov-2 infection and should not be used as the sole basis for treatment or other patient management decisions. A negative result may occur with  improper specimen collection/handling, submission of specimen other than nasopharyngeal swab, presence of viral mutation(s) within the areas targeted by this assay, and inadequate number of viral copies(<138 copies/mL). A negative result must be combined with clinical observations, patient history, and epidemiological information. The expected result is Negative.  Fact Sheet for Patients:  bloggercourse.com  Fact Sheet for Healthcare Providers:  seriousbroker.it  This test is no t yet approved or cleared by the [place]  FDA and  has been authorized for detection and/or diagnosis of  SARS-CoV-2 by FDA under an Emergency Use Authorization (EUA). This EUA will remain  in effect (meaning this test can be used) for the duration of the COVID-19 declaration under Section 564(b)(1) of the Act, 21 U.S.C.section 360bbb-3(b)(1), unless the authorization is terminated  or revoked sooner.       Influenza A by PCR NEGATIVE NEGATIVE Final   Influenza B by PCR NEGATIVE NEGATIVE Final    Comment: (NOTE) The Xpert Xpress SARS-CoV-2/FLU/RSV plus assay is intended as an aid in the diagnosis of influenza from Nasopharyngeal swab specimens and should not be used as a sole basis for treatment. Nasal washings and aspirates are unacceptable for Xpert Xpress SARS-CoV-2/FLU/RSV testing.  Fact Sheet for Patients: bloggercourse.com  Fact Sheet for Healthcare Providers: seriousbroker.it  This test is not yet approved or cleared by the [place]  FDA and has been authorized for detection and/or diagnosis of SARS-CoV-2 by FDA under an Emergency Use Authorization (EUA). This EUA will remain in effect (meaning this test can be used) for the duration of the COVID-19 declaration under Section 564(b)(1) of the Act, 21 U.S.C. section 360bbb-3(b)(1), unless the authorization is terminated or revoked.  Resp Syncytial Virus by PCR NEGATIVE NEGATIVE Final    Comment: (NOTE) Fact Sheet for Patients: bloggercourse.com  Fact Sheet for Healthcare Providers: seriousbroker.it  This test is not yet approved or cleared by the [place]  FDA and has been authorized for detection and/or diagnosis of SARS-CoV-2 by FDA under an Emergency Use Authorization (EUA). This EUA will remain in effect (meaning this test can be used) for the duration of the COVID-19 declaration under Section 564(b)(1) of the Act, 21 U.S.C. section 360bbb-3(b)(1), unless the authorization is terminated  or revoked.  Performed at [facility], [address], [place], [place] [address]   MRSA Next Gen by PCR, Nasal     Status: Abnormal   Collection Time: 12/14/23  2:39 PM   Specimen: Nasal Mucosa; Nasal Swab  Result Value Ref Range Status   MRSA by PCR Next Gen DETECTED (A) NOT DETECTED Final    Comment: PATIENT DISCHARGED OR EXPIRED (NOTE) The GeneXpert MRSA Assay (FDA approved for NASAL specimens only), is one component of a comprehensive MRSA colonization surveillance program. It is not intended to diagnose MRSA infection nor to guide or monitor treatment for MRSA infections. Test performance is not FDA approved in patients less than 53 years old. Performed at [facility], [address], [place], [place] [address]     Labs: CBC: Recent Labs  Lab 12/13/23 1631 12/14/23 0840  WBC 7.7 7.3  HGB 15.1* 15.2*  HCT 44.5 46.0  MCV 89.7 90.6  PLT 276 286   Basic Metabolic Panel: Recent Labs  Lab 12/13/23 1631 12/13/23 1632 12/14/23 0840  NA 135  --  137  K 4.2  --  4.5  CL 101  --  102  CO2 25  --  26  GLUCOSE 132*  --  107*  BUN 7  --  7  CREATININE 0.74  --  0.79  CALCIUM 9.0  --  9.3  MG  --  1.9  --   PHOS  --   --  4.1   Liver Function Tests: Recent Labs  Lab 12/13/23 1631  AST 13*  ALT 15  ALKPHOS 64  BILITOT 0.2  PROT 7.2  ALBUMIN 4.0   CBG: No results for input(s): GLUCAP in the last 168 hours.  Discharge time spent: approximately 45 minutes spent on discharge counseling, evaluation of patient on day of discharge, and coordination of discharge planning with nursing, social work, pharmacy and case management  Signed: Lonni SHAUNNA Dalton, MD [facility] 12/14/2023

## 2023-12-14 NOTE — Consult Note (Addendum)
 Cardiology Consultation   Patient ID: Brittany Hoffman MRN: [id number]; DOB: Apr 03, 2000  Admit date: 12/13/2023 Date of Consult: 12/14/2023  PCP:  Freddrick Johns   [facility] Providers Cardiologist:  Oneil Parchment, MD      Patient Profile: Brittany Hoffman is a 23 y.o. female with a hx of hypertension, tobacco abuse, syncope, bradycardia, and second-degree type I heart block who is being seen 12/14/2023 for the evaluation of bradycardia at the request of Owen Lore MD.  History of Present Illness: Brittany Hoffman is a 23 year old female who was previously seen by cardiology on 09/2022 for recurrent syncope and bradycardia.  The patient had reportedly passed out 3 times.  EKG in the emergency department showed bradycardia with a rate of 35.  Her orthostatic vital signs were also positive. When the patient was hospitalized she was also hypotensive with blood pressures in the 80s over 50s.  The blood pressures improved spontaneously.  On telemetry the patient was having occasional episodes of second-degree type I heart block.  Dr. 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  TTE during that hospitalization showed a normal LVEF of 60 to 65%, trivial MR, and grossly normal valve function  A cardiac event monitor was ordered and worn by the patient for 30 days on 9-10/2022.  The monitor was normal and showed rare PVCs, no A-fib, no pauses and no other electrical abnormalities.  Heart rates varied from 160-68  Patient presented to the emergency department for several days of nausea, vomiting, and diarrhea.  The patient also felt lightheaded with standing.  The patient was transferred from [facility] to [facility].  On interview the patient reported that she was throwing up and had diarrhea on Friday and Saturday.  On Saturday she started feeling lightheaded and passed out about 4 times when she was getting up.   Stated that she has had poor oral intake and that the meal she ate today was the second meal she has had since this started.  Has had fatigue for 4 months.  Denies any chest pain, lower extremity edema, orthopnea, or shortness of breath.  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  Labs showed potassium of 4.5, creatinine of 0.79, BUN of 7, WBC count of 7.3, hemoglobin of 15.2, decreased TSH of 0.327, negative respiratory panel, and negative UA.  EKG showed sinus bradycardia with a rate of 42.    Past Medical History:  Diagnosis Date   Known health problems: none     Past Surgical History:  Procedure Laterality Date   NO PAST SURGERIES       Home Medications:  Prior to Admission medications   Not on File    Scheduled Meds:  sodium chloride  flush  3 mL Intravenous Q12H   Continuous Infusions:  lactated ringers      PRN Meds: ondansetron  **OR** ondansetron  (ZOFRAN ) IV  Allergies:   No Known Allergies  Social History:   Social History   Socioeconomic History   Marital status: Significant Other    Spouse name: Not on file   Number of children: Not on file   Years of education: Not on file   Highest education level: Not on file  Occupational History   Not on file  Tobacco Use   Smoking status: Some Days    Types: Cigars   Smokeless tobacco: Never  Vaping Use   Vaping status:  Never Used  Substance and Sexual Activity   Alcohol use: Yes    Comment: socially   Drug use: Not Currently   Sexual activity: Yes  Other Topics Concern   Not on file  Social History Narrative   ** Merged History Encounter **       Social Drivers of Health   Financial Resource Strain: Not on file  Food Insecurity: No Food Insecurity (12/14/2023)   Hunger Vital Sign    Worried About Running Out of Food in the Last Year: Never true    Ran Out of Food in the Last Year: Never true  Transportation Needs: No Transportation Needs (12/14/2023)   PRAPARE -  Administrator, Civil Service (Medical): No    Lack of Transportation (Non-Medical): No  Physical Activity: Not on file  Stress: Not on file  Social Connections: Not on file  Intimate Partner Violence: Not At Risk (12/14/2023)   Humiliation, Afraid, Rape, and Kick questionnaire    Fear of Current or Ex-Partner: No    Emotionally Abused: No    Physically Abused: No    Sexually Abused: No    Family History:    Family History  Problem Relation Age of Onset   Hypertension Mother      ROS:  Please see the history of present illness.   All other ROS reviewed and negative.     Physical Exam/Data: Vitals:   12/14/23 0758 12/14/23 0957 12/14/23 1004 12/14/23 1201  BP:   115/69 100/67  Pulse:   (!) 48 (!) 47  Resp:   15 15  Temp: 97.6 F (36.4 C)  97.8 F (36.6 C) 98.4 F (36.9 C)  TempSrc: Oral  Oral Oral  SpO2:   100% 92%  Weight:  86.7 kg    Height:  5' 11 (1.803 m)      Intake/Output Summary (Last 24 hours) at 12/14/2023 1342 Last data filed at 12/13/2023 2336 Gross per 24 hour  Intake 1500 ml  Output --  Net 1500 ml      12/14/2023    9:57 AM 12/13/2023    8:12 PM 07/27/2023    6:15 PM  Last 3 Weights  Weight (lbs) 191 lb 2.2 oz 179 lb 14.3 oz 180 lb  Weight (kg) 86.7 kg 81.6 kg 81.647 kg     Body mass index is 26.66 kg/m.  General:  Well nourished, well developed, in no acute distress.  Alert and orientated on room air.  The patient was able to ambulate to and from the bathroom. HEENT: normal Neck: no JVD Vascular: No carotid bruits; Distal pulses 2+ bilaterally Cardiac:  normal S1, S2; RRR; no murmur  Lungs:  clear to auscultation bilaterally, no wheezing, rhonchi or rales  Abd: soft, nontender, no hepatomegaly  Ext: no edema Musculoskeletal:  No deformities Skin: warm and dry  Neuro:   no focal abnormalities noted Psych:  Normal affect   EKG:  The EKG was personally reviewed and demonstrates:  sinus bradycardia with a rate of 42. Telemetry:   Telemetry was personally reviewed and demonstrates: Sinus rhythm with heart rates from the 30s to 60s.  Relevant CV Studies: None  Laboratory Data: High Sensitivity Troponin:  No results for input(s): TROPONINIHS in the last 720 hours.   Chemistry Recent Labs  Lab 12/13/23 1631 12/13/23 1632 12/14/23 0840  NA 135  --  137  K 4.2  --  4.5  CL 101  --  102  CO2 25  --  26  GLUCOSE 132*  --  107*  BUN 7  --  7  CREATININE 0.74  --  0.79  CALCIUM 9.0  --  9.3  MG  --  1.9  --   GFRNONAA >60  --  >60  ANIONGAP 9  --  8    Recent Labs  Lab 12/13/23 1631  PROT 7.2  ALBUMIN 4.0  AST 13*  ALT 15  ALKPHOS 64  BILITOT 0.2   Lipids No results for input(s): CHOL, TRIG, HDL, LABVLDL, LDLCALC, CHOLHDL in the last 168 hours.  Hematology Recent Labs  Lab 12/13/23 1631 12/14/23 0840  WBC 7.7 7.3  RBC 4.96 5.08  HGB 15.1* 15.2*  HCT 44.5 46.0  MCV 89.7 90.6  MCH 30.4 29.9  MCHC 33.9 33.0  RDW 13.1 13.0  PLT 276 286   Thyroid  Recent Labs  Lab 12/14/23 0840  TSH 0.327*    BNPNo results for input(s): BNP, PROBNP in the last 168 hours.  DDimer No results for input(s): DDIMER in the last 168 hours.  Radiology/Studies:  No results found.   Assessment and Plan:  Harrietta Incorvaia is a 23 y.o. female with a hx of hypertension, tobacco abuse, syncope, bradycardia, and second-degree type I heart block who is being seen 12/14/2023 for the evaluation of bradycardia at the request of Owen Lore MD.  Nausea Vomiting Diarrhea the patient reported that she was throwing up and had diarrhea on Friday and Saturday.  On Saturday she started feeling lightheaded and passed out about 4 times when she was getting up.  Stated that she has had poor oral intake and that the meal she ate today was the second meal she has had since this started on friday.  The patient had similar symptoms last time she was seen by cardiology on  09/2023.   Syncope Bradycardia Second-degree type I heart block Suspected orthostatic hypotension On 09/2022 the patient was seen for symptomatic bradycardia in the setting of GI upset.  She also had several episodes of second-degree type I heart block.  It was felt this was likely from dysautonomia from vagal stimulation from the abdominal issue. TTE during that hospitalization showed a normal LVEF of 60 to 65%, trivial MR, and grossly normal valve function. A cardiac event monitor was ordered and worn by the patient for 30 days on 9-10/2022.  The monitor was normal and showed rare PVCs, no A-fib, no pauses and no other electrical abnormalities.  Heart rates varied from 160-68. The patient has had similar symptoms this hospitalization including nausea, vomiting, diarrhea, poor oral intake, lightheadedness, dizziness, and bradycardia. Labs showed decreased TSH of 0.327, potassium of 4.5, magnesium  of 1.9. Telemetry showed heart rate from the 30s to 60s.   I suspect that nausea, vomiting, and poor oral intake have contributed to the patient's lightheadedness and syncope. Ordered T3 and T4 Avoid AV nodal agents May consider ordering an outpatient cardiac monitor.   Tobacco abuse Patient reported that she smokes a tobacco pipe. Recommend cessation.     Risk Assessment/Risk Scores:         [facility] will sign off.   The patient is ready for discharge today from a cardiac standpoint. Medication Recommendations:  none  Follow up as an outpatient:  follow up with Dr Jeffrie.  For questions or updates, please contact [facility] Please consult [email or web address] for contact info under     Signed, Morse Clause, PA-C  12/14/2023 1:42 PM

## 2023-12-14 NOTE — Plan of Care (Signed)

## 2023-12-14 NOTE — TOC CM/SW Note (Signed)
 Transition of Care [facility]) - Inpatient Brief Assessment   Patient Details  Name: Regan Mcbryar MRN: [id number] Date of Birth: Oct 22, 2000  Transition of Care [facility]) CM/SW Contact:    Lauraine FORBES Saa, LCSWA Phone Number: 12/14/2023, 12:38 PM   Clinical Narrative:  12:39 PM Per chart review, patient resides at home with parent(s) and significant other. Patient does not have insurance or a PCP. Financial counseling was consulted for insurance needs. Patient does not have SNF/HH/DME history. Patient's preferred pharmacy's are [facility] [address] [facility] and [facility] [address]. Patient accepted CSW offer of PCP appointment and expressed preference in PCP appointment in [place] anytime Fridays. RNCM was made aware. TOC will continue to follow.  Transition of Care Asessment: Insurance and Status: Selfpay Patient has primary care physician: No Home environment has been reviewed: Private Residence Prior level of function:: N/A Prior/Current Home Services: No current home services Social Drivers of Health Review: SDOH reviewed no interventions necessary Readmission risk has been reviewed: Yes (Currently Green 7%) Transition of care needs: transition of care needs identified, TOC will continue to follow

## 2023-12-14 NOTE — TOC Progression Note (Signed)
 Transition of Care [facility]) - Progression Note    Patient Details  Name: Brittany Hoffman MRN: [id number] Date of Birth: [birth year]  Transition of Care [facility]) CM/SW Contact  Roxie KANDICE Stain, RN Phone Number: 12/14/2023, 2:06 PM  Clinical Narrative:    Patient is agreeable for ICM (Inpatient Care Management) to make new PCP apt. Sent referral to CMA to make PCP apt.                      Expected Discharge Plan and Services                                               Social Drivers of Health (SDOH) Interventions SDOH Screenings   Food Insecurity: No Food Insecurity (12/14/2023)  Housing: Unknown (12/14/2023)  Transportation Needs: No Transportation Needs (12/14/2023)  Utilities: Not At Risk (12/14/2023)  Tobacco Use: High Risk (12/13/2023)    Readmission Risk Interventions     No data to display

## 2023-12-14 NOTE — Progress Notes (Unsigned)
 Enrolled for [employer] to mail a ZIO XT long term holter monitor to the patients address on file.   Dr. Jeffrie to read.

## 2023-12-14 NOTE — Progress Notes (Signed)
 PROGRESS NOTE    Brittany Hoffman  FMW:[id number] DOB: September 29, 2000 DOA: 12/13/2023 PCP: Pcp, No   Brief Narrative: 23 year old with past medical for bradycardia, tobacco abuse, hypertension, who had a previous hospitalization over a year ago for symptomatic bradycardia her heart rate was in the 30s during that admission and subsequently improved to the 50s.  At that point it was thought that she might had some vasovagal Patient presented last night with nausea vomiting and diarrhea that started 3 days ago.  She presented very weak and lightheaded on standing.  Try to keep herself hydrated.  In the ED she received 1 L of IV fluid and she is feeling a little bit better.  Diarrhea stopped the day prior to admission  She was noted to have heart rate 30s, cardiology has been consulted and patient will be admitted to [facility]   Assessment & Plan:   Principal Problem:   Bradycardia Active Problems:   Tobacco abuse   1-Bradycardia:  heart rate in the 30s and 40.   She denies lightheadedness when I saw her today.  She is feeling a little bit better. Cardiology has been consulted. Check TSH No significant electrolyte abnormality Dehydration: In the setting of gastroenteritis.  Received IV fluids.  Diarrhea has resolved.  She has been able to tolerate clear liquid.  Tobacco: Counseled  Hypertension: Listed on previous medical history, blood pressure 120  Estimated body mass index is 25.09 kg/m as calculated from the following:   Height as of this encounter: 5' 11 (1.803 m).   Weight as of this encounter: 81.6 kg.   DVT prophylaxis: SCD Code Status: Full code Family Communication: Disposition Plan:  Status is: Inpatient Remains inpatient appropriate because: management of dehydration, bradycardia.     Consultants:  Cardiology   Procedures:    Antimicrobials:    Subjective: Patient report feeling better. No further diarrhea. Tolerated water.  Denies dizziness.    Objective: Vitals:   12/14/23 0000 12/14/23 0414 12/14/23 0700 12/14/23 0758  BP: (!) 158/96 (!) 142/87 134/89   Pulse: (!) 35 (!) 36 (!) 36   Resp: 13 13 15    Temp: 97.7 F (36.5 C) 98 F (36.7 C)  97.6 F (36.4 C)  TempSrc:  Oral  Oral  SpO2: 100% 100% 100%   Weight:      Height:        Intake/Output Summary (Last 24 hours) at 12/14/2023 0819 Last data filed at 12/13/2023 2336 Gross per 24 hour  Intake 1500 ml  Output --  Net 1500 ml   Filed Weights   12/13/23 2012  Weight: 81.6 kg    Examination:  General exam: Appears calm and comfortable  Respiratory system: Clear to auscultation. Respiratory effort normal. Cardiovascular system: S1 & S2 heard, RRR. No JVD, murmurs, rubs, gallops or clicks. No pedal edema. Gastrointestinal system: Abdomen is nondistended, soft and nontender. No organomegaly or masses felt. Normal bowel sounds heard. Central nervous system: Alert and oriented. No focal neurological deficits. Extremities: Symmetric 5 x 5 power.   Data Reviewed: I have personally reviewed following labs and imaging studies  CBC: Recent Labs  Lab 12/13/23 1631  WBC 7.7  HGB 15.1*  HCT 44.5  MCV 89.7  PLT 276   Basic Metabolic Panel: Recent Labs  Lab 12/13/23 1631 12/13/23 1632  NA 135  --   K 4.2  --   CL 101  --   CO2 25  --   GLUCOSE 132*  --   BUN  7  --   CREATININE 0.74  --   CALCIUM 9.0  --   MG  --  1.9   GFR: Estimated Creatinine Clearance: 120.2 mL/min (by C-G formula based on SCr of 0.74 mg/dL). Liver Function Tests: Recent Labs  Lab 12/13/23 1631  AST 13*  ALT 15  ALKPHOS 64  BILITOT 0.2  PROT 7.2  ALBUMIN 4.0   No results for input(s): LIPASE, AMYLASE in the last 168 hours. No results for input(s): AMMONIA in the last 168 hours. Coagulation Profile: No results for input(s): INR, PROTIME in the last 168 hours. Cardiac Enzymes: No results for input(s): CKTOTAL, CKMB, CKMBINDEX, TROPONINI in the last 168  hours. BNP (last 3 results) No results for input(s): PROBNP in the last 8760 hours. HbA1C: No results for input(s): HGBA1C in the last 72 hours. CBG: No results for input(s): GLUCAP in the last 168 hours. Lipid Profile: No results for input(s): CHOL, HDL, LDLCALC, TRIG, CHOLHDL, LDLDIRECT in the last 72 hours. Thyroid Function Tests: No results for input(s): TSH, T4TOTAL, FREET4, T3FREE, THYROIDAB in the last 72 hours. Anemia Panel: No results for input(s): VITAMINB12, FOLATE, FERRITIN, TIBC, IRON, RETICCTPCT in the last 72 hours. Sepsis Labs: No results for input(s): PROCALCITON, LATICACIDVEN in the last 168 hours.  Recent Results (from the past 240 hours)  Resp panel by RT-PCR (RSV, Flu A&B, Covid) Anterior Nasal Swab     Status: None   Collection Time: 12/13/23  3:35 PM   Specimen: Anterior Nasal Swab  Result Value Ref Range Status   SARS Coronavirus 2 by RT PCR NEGATIVE NEGATIVE Final    Comment: (NOTE) SARS-CoV-2 target nucleic acids are NOT DETECTED.  The SARS-CoV-2 RNA is generally detectable in upper respiratory specimens during the acute phase of infection. The lowest concentration of SARS-CoV-2 viral copies this assay can detect is 138 copies/mL. A negative result does not preclude SARS-Cov-2 infection and should not be used as the sole basis for treatment or other patient management decisions. A negative result may occur with  improper specimen collection/handling, submission of specimen other than nasopharyngeal swab, presence of viral mutation(s) within the areas targeted by this assay, and inadequate number of viral copies(<138 copies/mL). A negative result must be combined with clinical observations, patient history, and epidemiological information. The expected result is Negative.  Fact Sheet for Patients:  bloggercourse.com  Fact Sheet for Healthcare Providers:   seriousbroker.it  This test is no t yet approved or cleared by the [place]  FDA and  has been authorized for detection and/or diagnosis of SARS-CoV-2 by FDA under an Emergency Use Authorization (EUA). This EUA will remain  in effect (meaning this test can be used) for the duration of the COVID-19 declaration under Section 564(b)(1) of the Act, 21 U.S.C.section 360bbb-3(b)(1), unless the authorization is terminated  or revoked sooner.       Influenza A by PCR NEGATIVE NEGATIVE Final   Influenza B by PCR NEGATIVE NEGATIVE Final    Comment: (NOTE) The Xpert Xpress SARS-CoV-2/FLU/RSV plus assay is intended as an aid in the diagnosis of influenza from Nasopharyngeal swab specimens and should not be used as a sole basis for treatment. Nasal washings and aspirates are unacceptable for Xpert Xpress SARS-CoV-2/FLU/RSV testing.  Fact Sheet for Patients: bloggercourse.com  Fact Sheet for Healthcare Providers: seriousbroker.it  This test is not yet approved or cleared by the [place]  FDA and has been authorized for detection and/or diagnosis of SARS-CoV-2 by FDA under an Emergency Use Authorization (EUA). This EUA  will remain in effect (meaning this test can be used) for the duration of the COVID-19 declaration under Section 564(b)(1) of the Act, 21 U.S.C. section 360bbb-3(b)(1), unless the authorization is terminated or revoked.     Resp Syncytial Virus by PCR NEGATIVE NEGATIVE Final    Comment: (NOTE) Fact Sheet for Patients: bloggercourse.com  Fact Sheet for Healthcare Providers: seriousbroker.it  This test is not yet approved or cleared by the [place]  FDA and has been authorized for detection and/or diagnosis of SARS-CoV-2 by FDA under an Emergency Use Authorization (EUA). This EUA will remain in effect (meaning this test can be used) for  the duration of the COVID-19 declaration under Section 564(b)(1) of the Act, 21 U.S.C. section 360bbb-3(b)(1), unless the authorization is terminated or revoked.  Performed at [facility], [address], [place], [place] [address]          Radiology Studies: No results found.      Scheduled Meds:  sodium chloride  flush  3 mL Intravenous Q12H   Continuous Infusions:   LOS: 1 day    Time spent: 35 Minutes    Lydell Moga A Abdifatah Colquhoun, MD [facility]   If 7PM-7AM, please contact night-coverage [email or web address]  12/14/2023, 8:19 AM

## 2023-12-14 NOTE — Progress Notes (Signed)
 Outpatient 14 day zio monitor for syncopy Dr Jeffrie to read.

## 2023-12-14 NOTE — ED Notes (Signed)
[facility] has been called for transport.  

## 2023-12-17 LAB — T3: T3, Total: 101 ng/dL (ref 71–180)

## 2023-12-19 ENCOUNTER — Ambulatory Visit: Payer: Self-pay | Admitting: Student

## 2024-01-12 ENCOUNTER — Encounter: Payer: Self-pay | Admitting: Physician Assistant

## 2024-01-14 ENCOUNTER — Ambulatory Visit: Payer: Self-pay | Admitting: Physician Assistant

## 2024-02-12 ENCOUNTER — Ambulatory Visit (HOSPITAL_BASED_OUTPATIENT_CLINIC_OR_DEPARTMENT_OTHER): Payer: Self-pay | Admitting: Family Medicine

## 2024-03-24 ENCOUNTER — Ambulatory Visit: Payer: Self-pay | Admitting: Physician Assistant
